# Patient Record
Sex: Female | Born: 1960 | Race: Black or African American | Hispanic: No | Marital: Married | Smoking: Former smoker
Health system: Southern US, Community
[De-identification: ages and names within clinical notes are randomized; demographics above are authoritative.]

## PROBLEM LIST (undated history)

## (undated) DIAGNOSIS — K449 Diaphragmatic hernia without obstruction or gangrene: Secondary | ICD-10-CM

## (undated) DIAGNOSIS — N83202 Unspecified ovarian cyst, left side: Secondary | ICD-10-CM

## (undated) DIAGNOSIS — M1990 Unspecified osteoarthritis, unspecified site: Secondary | ICD-10-CM

## (undated) DIAGNOSIS — I10 Essential (primary) hypertension: Secondary | ICD-10-CM

## (undated) DIAGNOSIS — I700 Atherosclerosis of aorta: Secondary | ICD-10-CM

## (undated) HISTORY — DX: Essential (primary) hypertension: I10

---

## 2004-11-09 ENCOUNTER — Ambulatory Visit: Payer: Self-pay | Admitting: Psychiatry

## 2004-11-09 ENCOUNTER — Other Ambulatory Visit (HOSPITAL_COMMUNITY): Admission: RE | Admit: 2004-11-09 | Discharge: 2005-02-07 | Payer: Self-pay | Admitting: Psychiatry

## 2012-08-16 ENCOUNTER — Ambulatory Visit: Payer: Self-pay | Admitting: Family Medicine

## 2012-09-19 ENCOUNTER — Ambulatory Visit: Payer: Self-pay | Admitting: Family Medicine

## 2012-10-24 ENCOUNTER — Ambulatory Visit: Payer: Self-pay | Admitting: Unknown Physician Specialty

## 2013-12-09 ENCOUNTER — Emergency Department: Payer: Self-pay | Admitting: Emergency Medicine

## 2013-12-22 ENCOUNTER — Emergency Department: Payer: Self-pay | Admitting: Internal Medicine

## 2014-02-20 ENCOUNTER — Ambulatory Visit: Payer: Self-pay | Admitting: Family Medicine

## 2014-02-27 ENCOUNTER — Ambulatory Visit: Payer: Self-pay | Admitting: Family Medicine

## 2017-10-27 ENCOUNTER — Encounter: Payer: Self-pay | Admitting: Emergency Medicine

## 2017-10-27 ENCOUNTER — Emergency Department
Admission: EM | Admit: 2017-10-27 | Discharge: 2017-10-27 | Disposition: A | Discharge status: Home / Self Care | Payer: Self-pay | Attending: Emergency Medicine | Admitting: Emergency Medicine

## 2017-10-27 DIAGNOSIS — X58XXXA Exposure to other specified factors, initial encounter: Secondary | ICD-10-CM | POA: Insufficient documentation

## 2017-10-27 DIAGNOSIS — R202 Paresthesia of skin: Secondary | ICD-10-CM | POA: Insufficient documentation

## 2017-10-27 DIAGNOSIS — Y9389 Activity, other specified: Secondary | ICD-10-CM | POA: Insufficient documentation

## 2017-10-27 DIAGNOSIS — R600 Localized edema: Secondary | ICD-10-CM | POA: Insufficient documentation

## 2017-10-27 DIAGNOSIS — Y9259 Other trade areas as the place of occurrence of the external cause: Secondary | ICD-10-CM | POA: Insufficient documentation

## 2017-10-27 DIAGNOSIS — S66912A Strain of unspecified muscle, fascia and tendon at wrist and hand level, left hand, initial encounter: Secondary | ICD-10-CM | POA: Insufficient documentation

## 2017-10-27 DIAGNOSIS — Y990 Civilian activity done for income or pay: Secondary | ICD-10-CM | POA: Insufficient documentation

## 2017-10-27 LAB — CBC WITH DIFFERENTIAL/PLATELET
Basophils Absolute: 0 10*3/uL (ref 0–0.1)
Basophils Relative: 1 %
EOS ABS: 0.1 10*3/uL (ref 0–0.7)
EOS PCT: 1 %
HCT: 38 % (ref 35.0–47.0)
Hemoglobin: 12.2 g/dL (ref 12.0–16.0)
LYMPHS ABS: 2 10*3/uL (ref 1.0–3.6)
LYMPHS PCT: 40 %
MCH: 23.7 pg — ABNORMAL LOW (ref 26.0–34.0)
MCHC: 32.1 g/dL (ref 32.0–36.0)
MCV: 73.7 fL — AB (ref 80.0–100.0)
MONO ABS: 0.4 10*3/uL (ref 0.2–0.9)
MONOS PCT: 8 %
Neutro Abs: 2.5 10*3/uL (ref 1.4–6.5)
Neutrophils Relative %: 50 %
PLATELETS: 281 10*3/uL (ref 150–440)
RBC: 5.15 MIL/uL (ref 3.80–5.20)
RDW: 16.3 % — AB (ref 11.5–14.5)
WBC: 5.1 10*3/uL (ref 3.6–11.0)

## 2017-10-27 LAB — BASIC METABOLIC PANEL
Anion gap: 8 (ref 5–15)
BUN: 15 mg/dL (ref 6–20)
CHLORIDE: 108 mmol/L (ref 101–111)
CO2: 24 mmol/L (ref 22–32)
CREATININE: 0.84 mg/dL (ref 0.44–1.00)
Calcium: 8.7 mg/dL — ABNORMAL LOW (ref 8.9–10.3)
GFR calc Af Amer: >60 mL/min (ref >60)
GFR calc non Af Amer: >60 mL/min (ref >60)
GLUCOSE: 122 mg/dL — AB (ref 65–99)
POTASSIUM: 3.4 mmol/L — AB (ref 3.5–5.1)
Sodium: 140 mmol/L (ref 135–145)

## 2017-10-27 MED ORDER — LISINOPRIL 20 MG PO TABS: 20.0000 mg | ORAL_TABLET | Freq: Every day | ORAL | 2 refills | Status: DC

## 2017-10-27 MED ORDER — LISINOPRIL 10 MG PO TABS
20.0000 mg | ORAL_TABLET | Freq: Once | ORAL | Status: AC
  Administered 2017-10-27: 20 mg via ORAL

## 2017-10-27 MED ORDER — MELOXICAM 15 MG PO TABS: 15.0000 mg | ORAL_TABLET | Freq: Every day | ORAL | 1 refills | Status: AC

## 2017-10-27 MED ORDER — LISINOPRIL 10 MG PO TABS
ORAL_TABLET | ORAL | Status: AC
  Filled 2017-10-27: qty 2

## 2017-10-27 NOTE — ED Notes (Signed)
See triage note  states she developed pain to left hand/wrist area   Denies any specific injury  States she does a lot of repetative movement at work  Min relief with OTC ibu

## 2017-10-27 NOTE — ED Provider Notes (Signed)
[facility] Emergency Department Provider Note ____________________________________________  Time seen: 1258  I have reviewed the triage vital signs and the nursing notes.  HISTORY  Chief Complaint  Hand Pain  HPI Jackie Shea is a 57 y.o.right-handed female, who presents to the ED, accompanied by her husband, for evaluation of left (>right) hand and wrist pain. 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 She has noted increasing incidence of left > right hand pain, stiffness, and paresthesias. She notes pain increased related to her work activities. She also notes nighttime paresthesias and skin changes. She describes red hand and at times, stiffness that is only relieved with running her hands under hot water. She denies any recent injury, accident, or trauma. She does report weakness with her grip. She has dropped or nearly dropped tray of biscuits and boxes of buttermilk. She does ibuprofen, with limited pain relief. She has purchased and wears a neoprene thumb spica splint for support. She denies any previous evaluation or treatment. She plans to file a claim with her company's workers' compensation provider. She notified her supervisor that she was coming in today, her day off, for evaluation.  History reviewed. No pertinent past medical history.  There are no active problems to display for this patient.  History reviewed. No pertinent surgical history.  Prior to Admission medications   Medication Sig Start Date End Date Taking? Authorizing Provider  lisinopril (PRINIVIL,ZESTRIL) 20 MG tablet Take 1 tablet (20 mg total) by mouth daily. 10/27/17 01/25/18  Dilan Fullenwider, Charlesetta IvoryJenise V Bacon, PA-C  meloxicam (MOBIC) 15 MG tablet Take 1 tablet (15 mg total) by mouth daily. 10/27/17 12/26/17  Torrez Renfroe, Charlesetta IvoryJenise V Bacon, PA-C    Allergies Patient has no known allergies.  No family history on file.  Social History Social History   Tobacco  Use  . Smoking status: Former Games developermoker  . Smokeless tobacco: Never Used  Substance Use Topics  . Alcohol use: No    Frequency: Never  . Drug use: No    Review of Systems  Constitutional: Negative for fever. Cardiovascular: Negative for chest pain. Respiratory: Negative for shortness of breath. Gastrointestinal: Negative for abdominal pain, vomiting and diarrhea. Genitourinary: Negative for dysuria. Musculoskeletal: Negative for back pain. Left UE pain & paresthesias as above Skin: Negative for rash. Neurological: Negative for headaches, focal weakness or numbness. ____________________________________________  PHYSICAL EXAM:  VITAL SIGNS: ED Triage Vitals  Enc Vitals Group     BP 10/27/17 1233 (!) 159/89     Pulse Rate 10/27/17 1233 73     Resp 10/27/17 1233 13     Temp 10/27/17 1233 97.7 F (36.5 C)     Temp Source 10/27/17 1233 Oral     SpO2 10/27/17 1233 99 %     Weight 10/27/17 1233 170 lb (77.1 kg)     Height 10/27/17 1233 5\' 5"  (1.651 m)     Head Circumference --      Peak Flow --      Pain Score 10/27/17 1236 10     Pain Loc --      Pain Edu? --      Excl. in GC? --     Constitutional: Alert and oriented. Well appearing and in no distress. Head: Normocephalic and atraumatic. Neck: Supple. No thyromegaly. Cardiovascular: Normal rate, regular rhythm. Normal distal pulses. Respiratory: Normal respiratory effort. No wheezes/rales/rhonchi. Musculoskeletal: Hands without obvious deformity, edema, erythema, or soft tissue swelling. Normal composite fist, but  self-limited on the left. Nontender with normal range of motion in all extremities. Normal rotator cuff testing.  Neurologic:  Normal gross sensation. Normal intrinsic and opposition testing. Negative cubital/carpal Tinel's.  Normal speech and language. No gross focal neurologic deficits are appreciated. Skin:  Skin is warm, dry and intact. No rash noted. Normal capillary refill. No signs of scleroderma. Nails  normal without pitting, spooning, or clubbing.  Psychiatric: Mood and affect are normal. Patient exhibits appropriate insight and judgment. ____________________________________________   LABS (pertinent positives/negatives)  Labs Reviewed  BASIC METABOLIC PANEL - Abnormal; Notable for the following components:      Result Value   Potassium 3.4 (*)    Glucose, Bld 122 (*)    Calcium 8.7 (*)    All other components within normal limits  CBC WITH DIFFERENTIAL/PLATELET - Abnormal; Notable for the following components:   MCV 73.7 (*)    MCH 23.7 (*)    RDW 16.3 (*)    All other components within normal limits  ____________________________________________  PROCEDURES  Procedures Lisinopril 20 mg PO ____________________________________________  INITIAL IMPRESSION / ASSESSMENT AND PLAN / ED COURSE  Patient with ED evaluation of LUE paresthesias and weakness. Her exam is consistent with some likely overuse syndrome to the musculature of the forearms. She may also have some underlying arthritis. Discussed the fact that the symptoms could also be multifactorial: including HTN, peripheral edema, and arthritis causing her symptoms. We discussed the need to establish a medical home. She is placed in a velcro wrist splint for support. She will follow-up with her company HR representative and seek medical care with her worker's compensation provider. A prescription for her previous HTN medicine is provided. ____________________________________________  FINAL CLINICAL IMPRESSION(S) / ED DIAGNOSES  Final diagnoses:  Paresthesias in left hand  Overuse syndrome of hand, left, initial encounter  Hand edema     Lissa Hoard, PA-C 10/27/17 1723    Minna Antis, MD 10/28/17 1436

## 2017-10-27 NOTE — ED Triage Notes (Signed)
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  Patient plans to file this as a workers comp claim with [employer].  Patient in NAD at this time and has good dexterity in all fingers.  Patient states the pain is sharp and worsens with movement.

## 2017-10-27 NOTE — Discharge Instructions (Signed)
Your exam and labs are consistent with muscle strain and overuse syndrome. This is musculoskeletal (muscle, joint, & tendon) pain due to (work) activities. You have experienced hand pain, swelling, numbness, and weakness. These are common symptoms. Follow-up with one of the local community clinics to set up a medical home. Take the prescription med as directed. Follow-up with your company for referral to their provider of choice. Wear the splint while working.

## 2019-07-15 ENCOUNTER — Emergency Department: Payer: Self-pay

## 2019-07-15 ENCOUNTER — Encounter: Payer: Self-pay | Admitting: Emergency Medicine

## 2019-07-15 ENCOUNTER — Emergency Department
Admission: EM | Admit: 2019-07-15 | Discharge: 2019-07-15 | Disposition: A | Discharge status: Home / Self Care | Payer: Self-pay | Attending: Emergency Medicine | Admitting: Emergency Medicine

## 2019-07-15 ENCOUNTER — Other Ambulatory Visit: Payer: Self-pay

## 2019-07-15 DIAGNOSIS — Z87891 Personal history of nicotine dependence: Secondary | ICD-10-CM | POA: Insufficient documentation

## 2019-07-15 DIAGNOSIS — Z79899 Other long term (current) drug therapy: Secondary | ICD-10-CM | POA: Insufficient documentation

## 2019-07-15 DIAGNOSIS — M25532 Pain in left wrist: Secondary | ICD-10-CM | POA: Insufficient documentation

## 2019-07-15 DIAGNOSIS — G8929 Other chronic pain: Secondary | ICD-10-CM | POA: Insufficient documentation

## 2019-07-15 DIAGNOSIS — M5442 Lumbago with sciatica, left side: Secondary | ICD-10-CM | POA: Insufficient documentation

## 2019-07-15 DIAGNOSIS — M25512 Pain in left shoulder: Secondary | ICD-10-CM | POA: Insufficient documentation

## 2019-07-15 MED ORDER — HYDROCODONE-ACETAMINOPHEN 5-325 MG PO TABS
1.0000 | ORAL_TABLET | Freq: Once | ORAL | Status: AC
  Administered 2019-07-15: 1 via ORAL
  Filled 2019-07-15: qty 1

## 2019-07-15 MED ORDER — PREDNISONE 10 MG PO TABS: ORAL_TABLET | ORAL | 0 refills | Status: DC

## 2019-07-15 MED ORDER — ORPHENADRINE CITRATE 30 MG/ML IJ SOLN
60.0000 mg | Freq: Two times a day (BID) | INTRAMUSCULAR | Status: DC
  Administered 2019-07-15: 60 mg via INTRAMUSCULAR
  Filled 2019-07-15: qty 2

## 2019-07-15 MED ORDER — ORPHENADRINE CITRATE ER 100 MG PO TB12: 100.0000 mg | ORAL_TABLET | Freq: Two times a day (BID) | ORAL | 0 refills | Status: AC

## 2019-07-15 NOTE — ED Provider Notes (Signed)
[facility] Emergency Department Provider Note ____________________________________________  Time seen: 1920  I have reviewed the triage vital signs and the nursing notes.  HISTORY  Chief Complaint  Fall   HPI Jackie Shea is a 58 y.o. female presents to the ER today with complaint of acute on chronic left shoulder pain, left wrist pain, low back pain and left hip pain.  She reports this started January 2019 after a fall at work.  She describes the shoulder pain as sore and achy with decreased range of motion.  She denies elbow pain but believes she may have fractured her elbow during the fall that occurred on January 2019.  She describes sharp, shooting pains that start at the elbow and radiate into the tips of her fingers on her left hand.  She reports associated numbness, tingling and weakness.  She is right-handed not left-handed.  She describes the low back pain as sharp, stabbing and radiating into her bilateral lower legs, L >R. She denies weakness. She denies loss of bowel or bladder. She reports she initially had PT after her fall with some improvement in pain. She does not have a PCP, and has been wearing a brace to her left wrist and taking Ibuprofen/Tylenol with minimal relief. She can not sleep at night because the pain is too bad.  History reviewed. No pertinent past medical history.  There are no active problems to display for this patient.   History reviewed. No pertinent surgical history.  Prior to Admission medications   Medication Sig Start Date End Date Taking? Authorizing Provider  lisinopril (PRINIVIL,ZESTRIL) 20 MG tablet Take 1 tablet (20 mg total) by mouth daily. 10/27/17 01/25/18  Menshew, Dannielle Karvonen, PA-C  orphenadrine (NORFLEX) 100 MG tablet Take 1 tablet (100 mg total) by mouth 2 (two) times daily. 07/15/19 07/14/20  Jearld Fenton, NP  predniSONE (DELTASONE) 10 MG tablet Take 3 tabs on days 1-3, take 2 tabs on days 4-6, take 1 tab on  days 7-9 07/15/19   Jearld Fenton, NP    Allergies Patient has no known allergies.  No family history on file.  Social History Social History   Tobacco Use  . Smoking status: Former Research scientist (life sciences)  . Smokeless tobacco: Never Used  Substance Use Topics  . Alcohol use: No    Frequency: Never  . Drug use: No    Review of Systems  Constitutional: Negative for fever. Cardiovascular: Negative for chest pain or chest tightness. Respiratory: Negative for shortness of breath. Musculoskeletal: Positive for left shoulder pain, left wrist pain, low back pain and left hip pain.  Negative for neck, knee or ankle pain.  Negative for joint swelling. Skin: Negative for rash. Neurological: Positive for numbness, tingling of left arm and bilateral lower extremities.  Positive for weakness to the left upper extremity.   ____________________________________________  PHYSICAL EXAM:  VITAL SIGNS: ED Triage Vitals  Enc Vitals Group     BP 07/15/19 1739 (!) 159/81     Pulse Rate 07/15/19 1739 79     Resp 07/15/19 1739 16     Temp 07/15/19 1739 98.4 F (36.9 C)     Temp Source 07/15/19 1739 Oral     SpO2 07/15/19 1739 98 %     Weight 07/15/19 1737 165 lb (74.8 kg)     Height 07/15/19 1737 5\' 5"  (1.651 m)     Head Circumference --      Peak Flow --      Pain  Score 07/15/19 1736 8     Pain Loc --      Pain Edu? --      Excl. in GC? --     Constitutional: Alert and oriented. Well appearing and in no distress. Cardiovascular: Normal rate, regular rhythm.  Radial pulses 2+ bilaterally.  Pedal and PT pulse pulses 2+ bilaterally.  Cap refill less than 3 seconds. Respiratory: Normal respiratory effort. No wheezes/rales/rhonchi.. Musculoskeletal: Normal flexion, extension and rotation of the cervical spine.  No bony tenderness noted over the cervical spine.  Decreased external rotation of the left shoulder secondary to pain.  Normal internal rotation, abduction and abduction.  Normal flexion, extension  and rotation of the left elbow.  Normal flexion, extension and rotation of the left wrist.  Handgrips equal.  Decreased flexion and extension of the lumbar spine.  Bony tenderness noted over the lumbar spine.  Decreased internal and external rotation, abduction and abduction of the left hip.  Normal external and internal rotation, abduction and abduction of the right hip.  Normal flexion and extension of bilateral knees.  No joint swelling noted.  Strength 4/5 LLE.  Strength 5/5 RLE.  Gait not visualized. Neurologic:   Normal speech and language.  Positive SLR on the left. Skin:  Skin is warm, dry and intact. No rash noted. ____________________________________________   RADIOLOGY  Imaging Orders     DG Shoulder Left     DG Lumbar Spine Complete     DG Hip Unilat W or Wo Pelvis 2-3 Views Left     DG Wrist Complete Left IMPRESSION: 1. No acute osseous abnormality 2. Slight narrowing of the subacromial space, can be seen with rotator cuff disease  IMPRESSION: 1. 12 mm anterolisthesis L5 on S1. Associated moderate degenerative changes 2. No acute osseous abnormality  IMPRESSION: Negative.  IMPRESSION: 1. No acute osseous abnormality 2. 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     I reviewed the patient's prescription history over the last 12 months in the multi-state controlled substances database(s) that  includes [place], [place], [place], [place], [place], [place], [place], [place], [place], [place], [place], [place], [place], and [place].  Results were notable for no controlled substance in the last 6 months. ____________________________________________  FINAL CLINICAL IMPRESSION(S) / ED DIAGNOSES  Final diagnoses:  Chronic left shoulder pain  Chronic pain of left wrist  Chronic midline low back pain with left-sided sciatica   Nicki Reaper, NP    Lorre Munroe, NP 07/15/19 2147    Sharman Cheek, MD 07/19/19 1527

## 2019-07-15 NOTE — ED Notes (Signed)
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 Pt with c/o pain. No swelling noted.

## 2019-07-15 NOTE — ED Triage Notes (Signed)
Pt to ED via POV, pt states that she fell last year at work, has been in physical therapy at the time. Pt states that she has been having pain since she fell 1 year ago. Pt states that she is unable to sleep due to the pain. Pt is in NAD at this time.

## 2019-07-15 NOTE — Discharge Instructions (Addendum)
You were seen today for left shoulder pain, paresthesia left arm and low back pain with sciatica.  X-ray of your shoulder shows some narrowing that can be seen in rotator cuff issues.  The x-ray of your left wrist show some arthritis.  The x-ray of your low back shows some arthritis.  The x-ray of your left hip is normal.  I am given you a prescription for prednisone and a muscle relaxer.  Please do not take any anti-inflammatories OTC while on prednisone.  Be aware that the muscle relaxer may cause sedation.  Please call the orthopedist for follow-up

## 2020-11-17 IMAGING — CR DG SHOULDER 2+V*L*
1 series · 2 of 2 positions shown · non-contrast
Comparison: None.

CLINICAL DATA: Chronic pain for 1 year

EXAM:
LEFT SHOULDER - 2+ VIEW

[Series 1: dg shoulder left · 0.14mm/px · 2 of 2 slices shown]
[im 1/2]
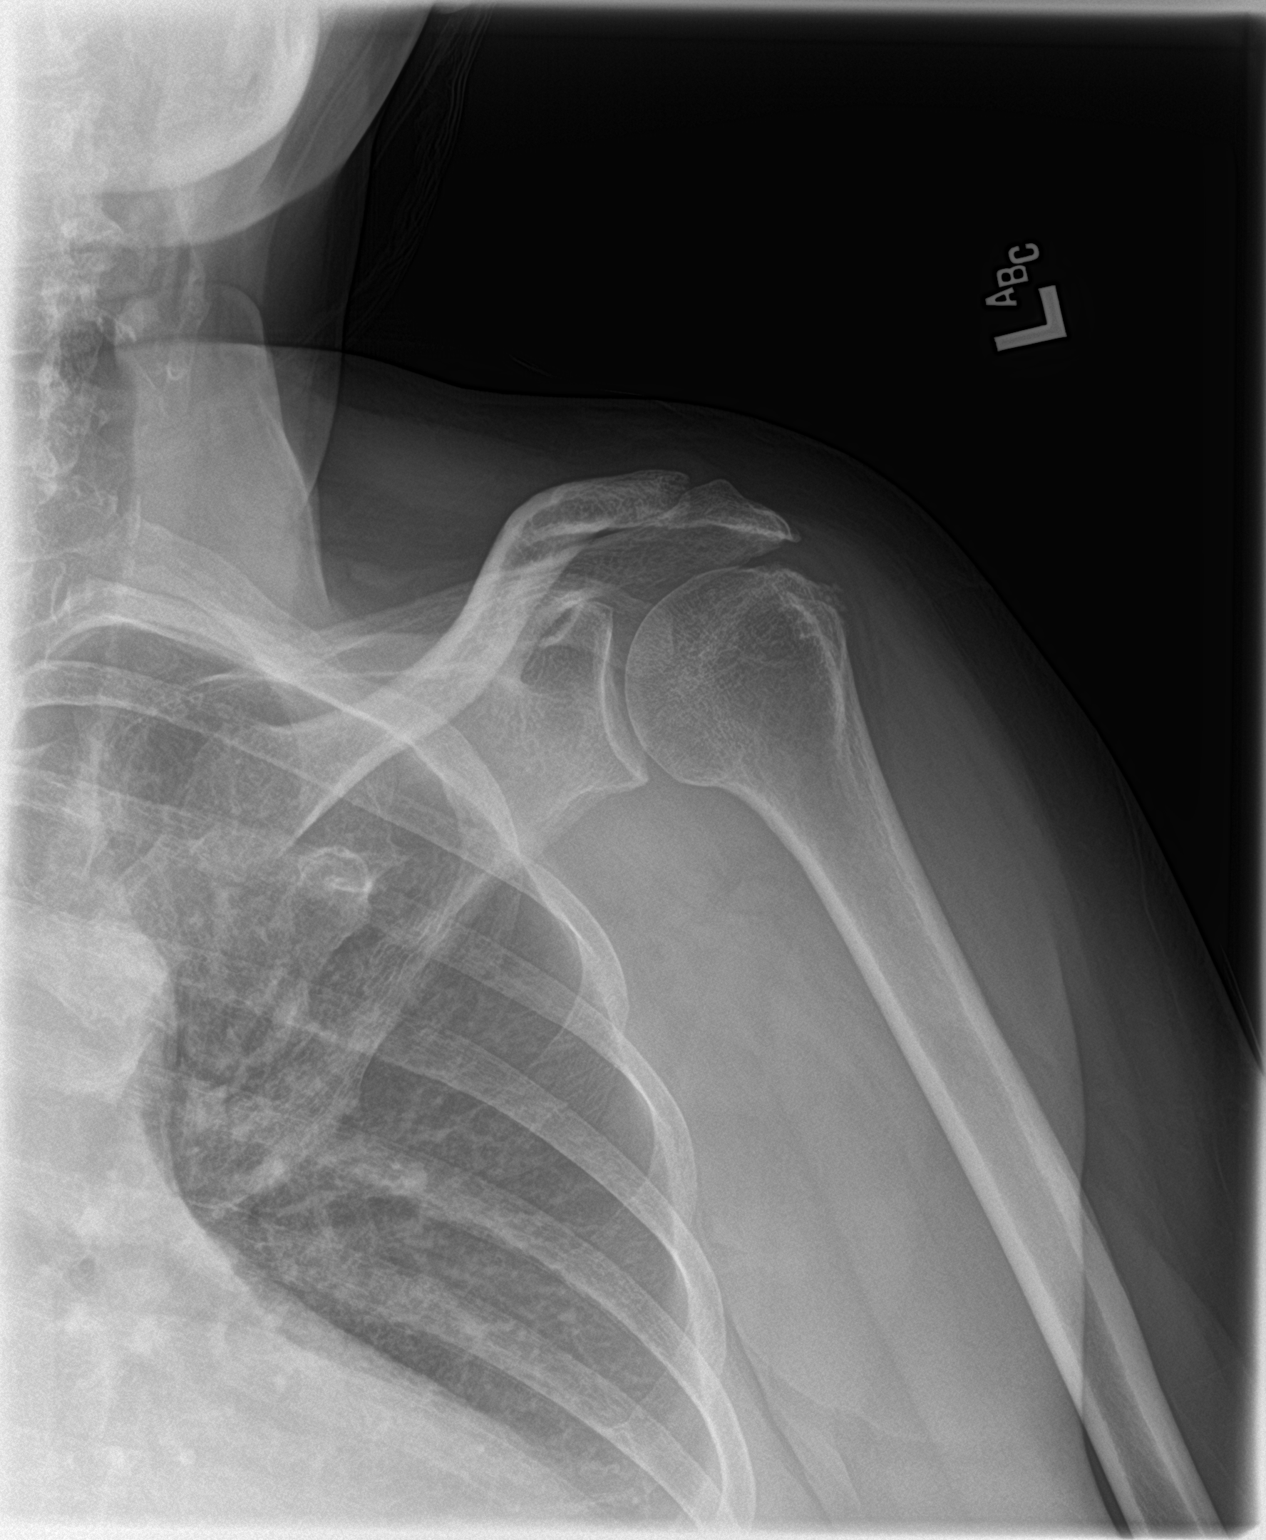
[im 2/2]
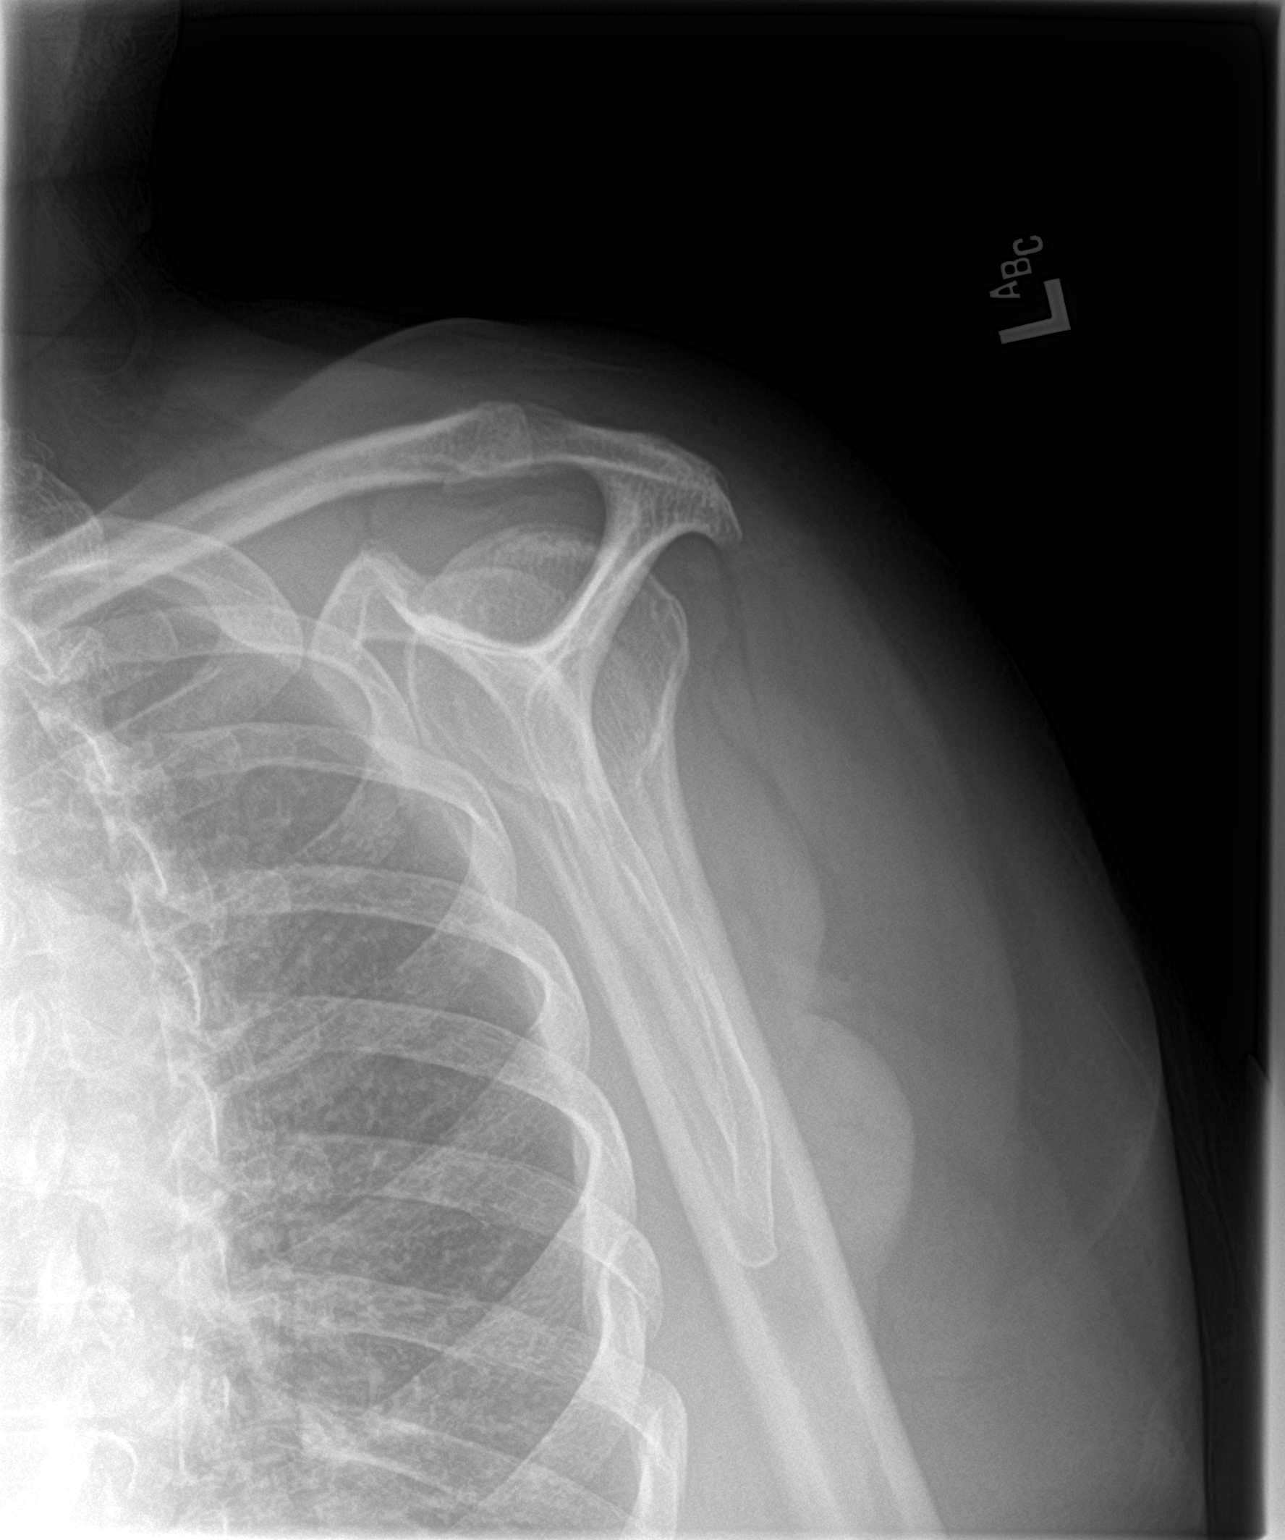

[2 of 2 positions shown; findings below may reference images not displayed]

FINDINGS: No fracture or malalignment. The AC joint is intact. Slight
narrowing of the subacromial space.
IMPRESSION: 1. No acute osseous abnormality
2. Slight narrowing of the subacromial space, can be seen with
rotator cuff disease

## 2020-11-17 IMAGING — CR DG HIP (WITH OR WITHOUT PELVIS) 2-3V*L*
1 series · 3 of 3 positions shown · non-contrast
Comparison: None.

CLINICAL DATA: Chronic pain for greater than a year

EXAM:
DG HIP (WITH OR WITHOUT PELVIS) 2-3V LEFT

[Series 1: dg hip unilat w or w/o pelvis 2-3 views  · non-contrast · 0.14mm/px · 3 of 3 slices shown]
[im 1/3]
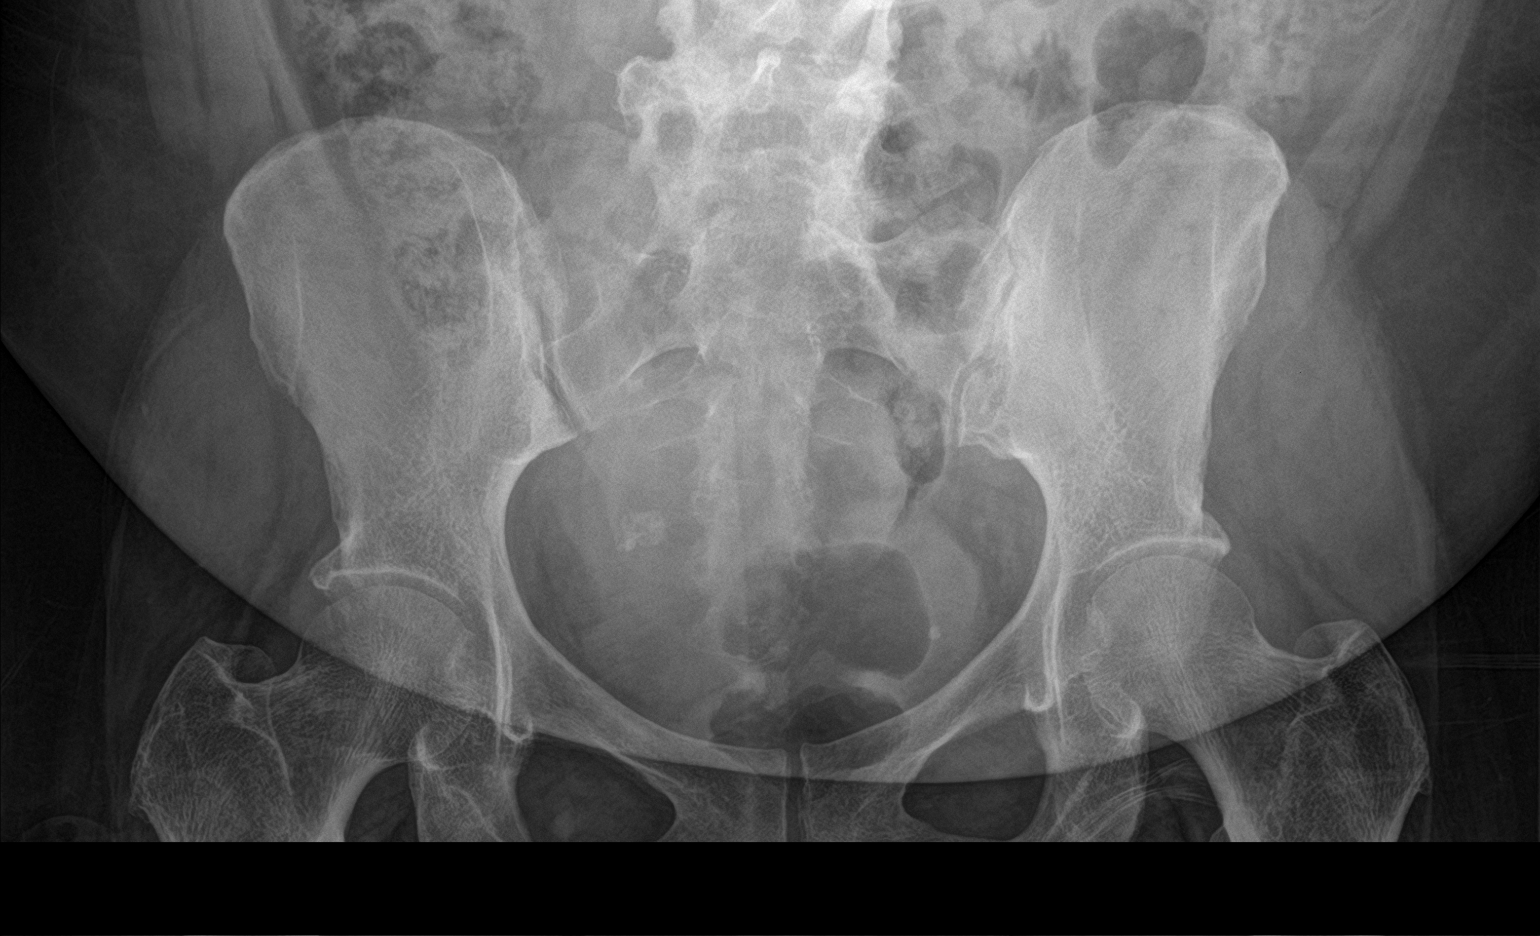
[im 2/3]
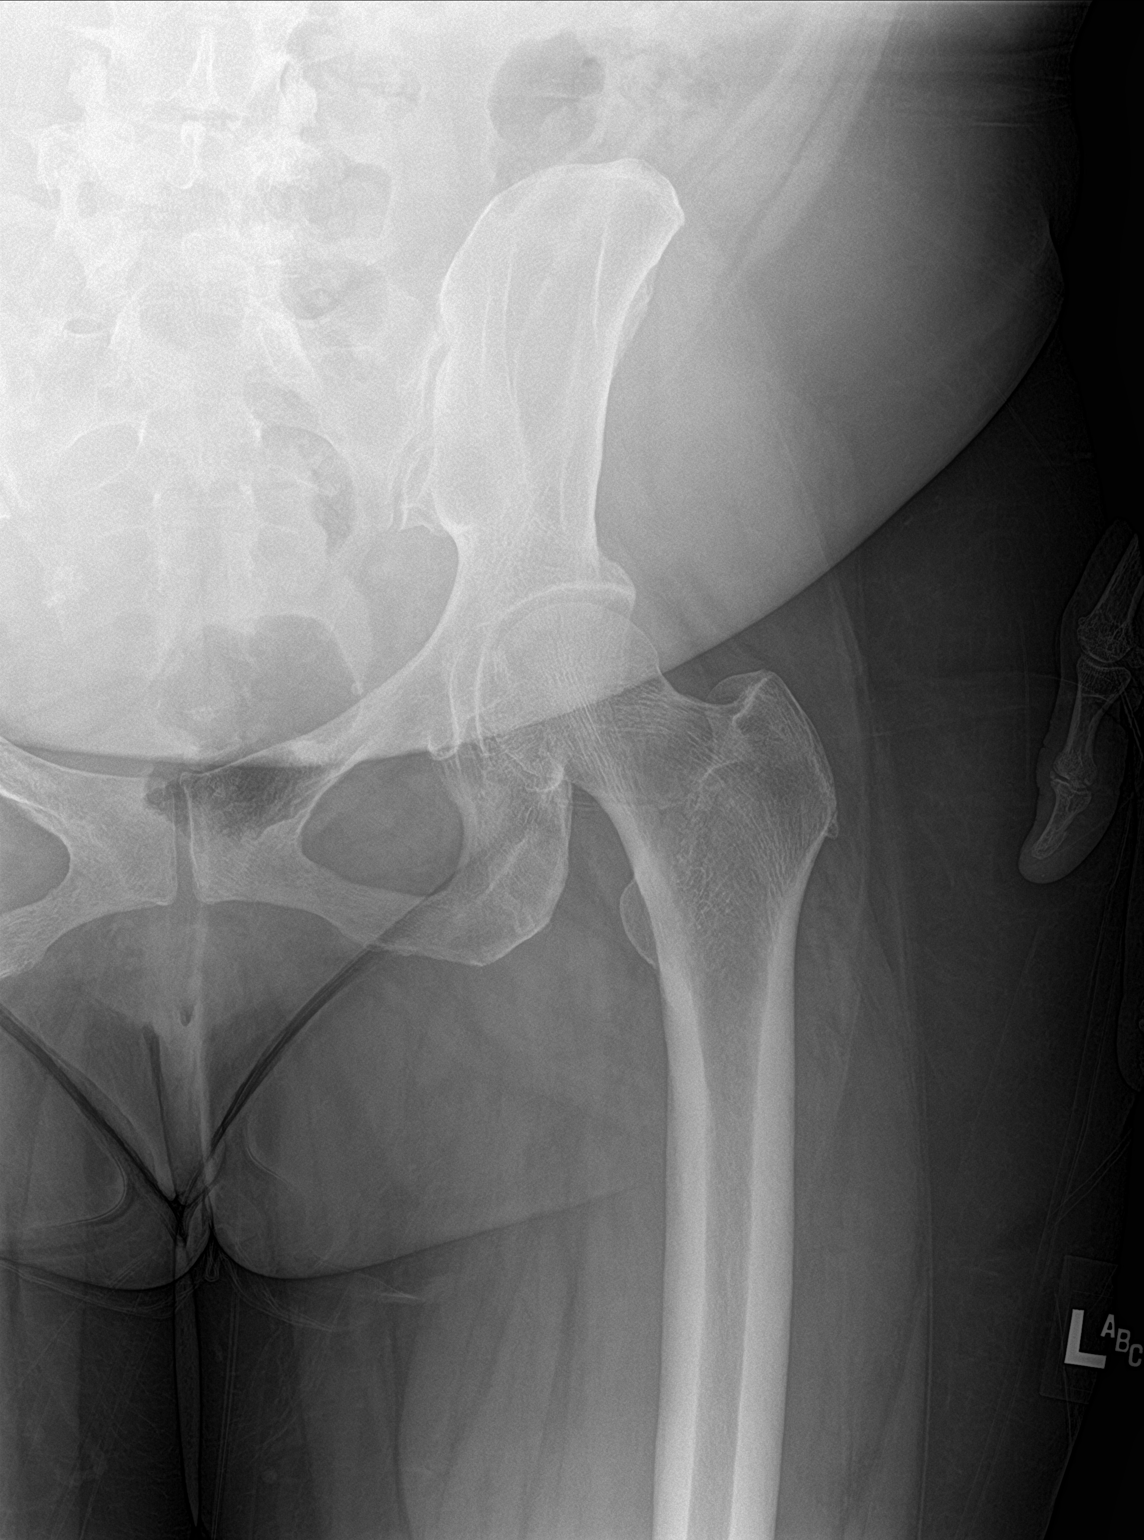
[im 3/3]
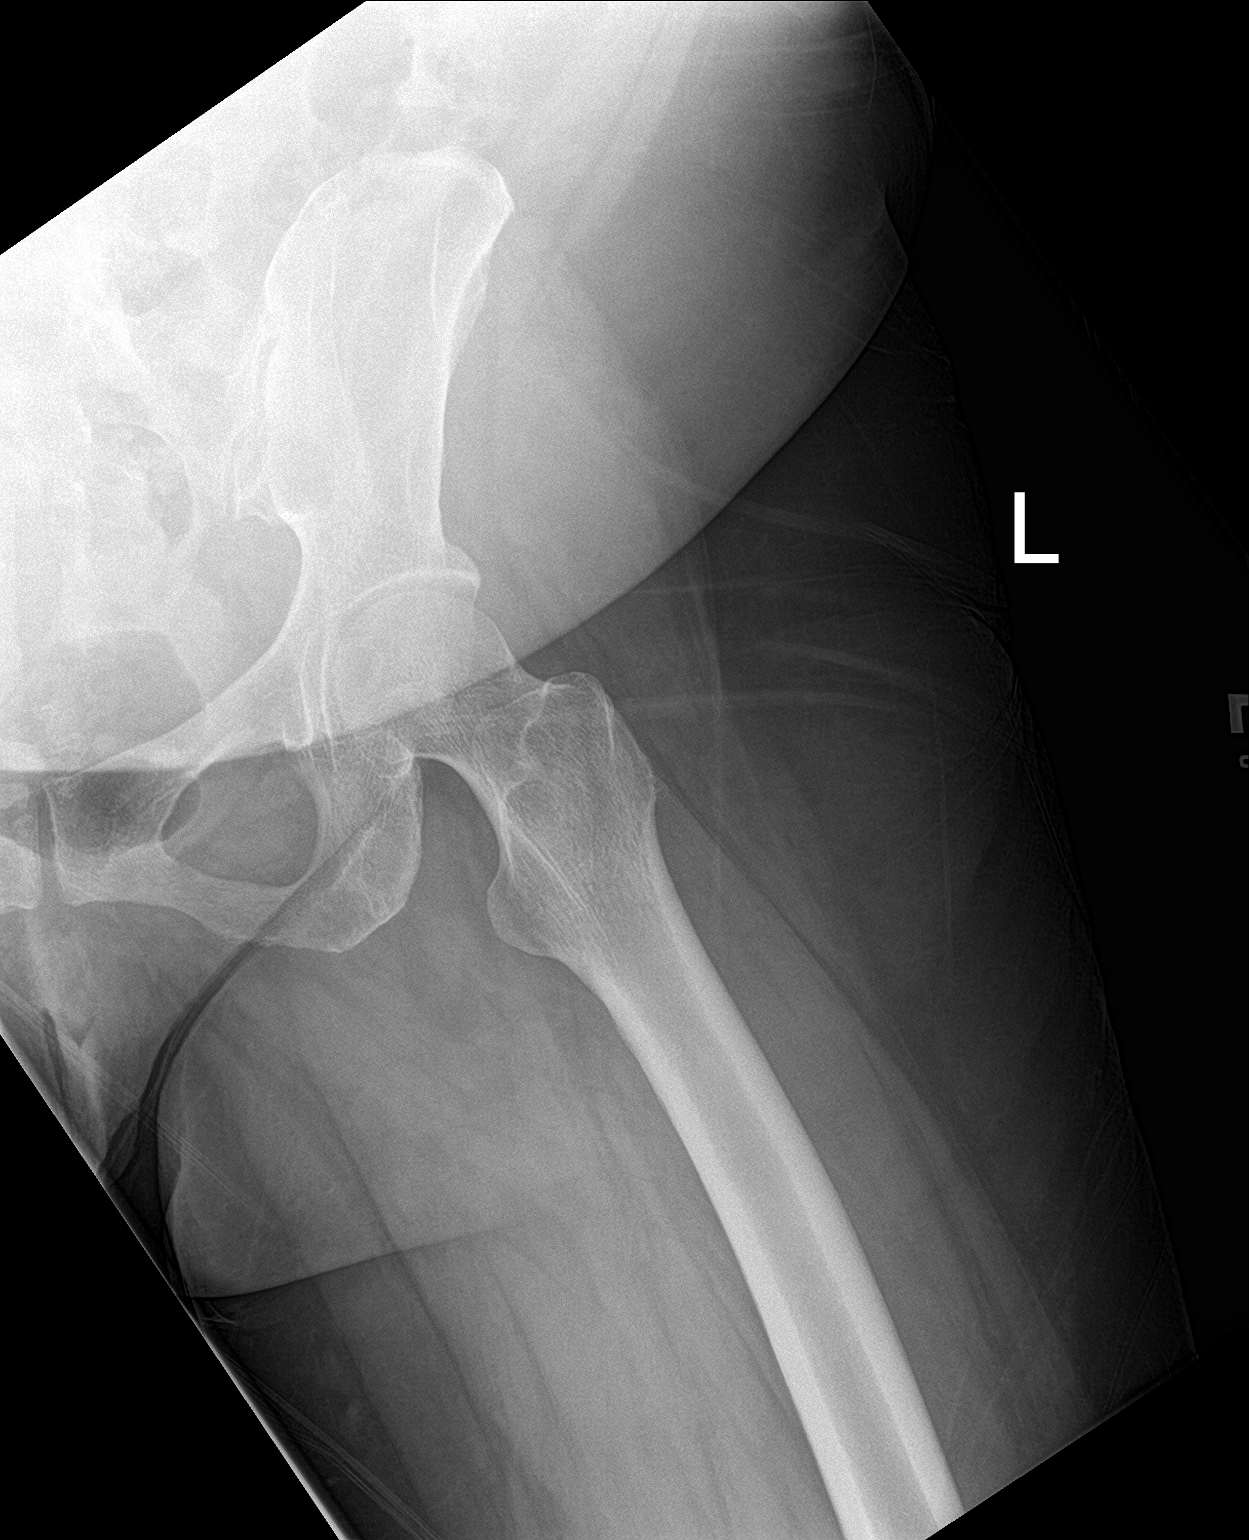

[3 of 3 positions shown; findings below may reference images not displayed]

FINDINGS: Mild right SI joint sclerosis. Pubic symphysis and rami appear
intact. Both femoral heads project in joint. No acute displaced
fracture or malalignment. Joint spaces are relatively maintained.
IMPRESSION: Negative.

## 2020-11-17 IMAGING — CR DG WRIST COMPLETE 3+V*L*
1 series · 4 of 4 positions shown · non-contrast
Comparison: None.

CLINICAL DATA: Chronic pain for a year

EXAM:
LEFT WRIST - COMPLETE 3+ VIEW

[Series 1: dg wrist complete left · 0.14mm/px · 4 of 4 slices shown]
[im 1/4]
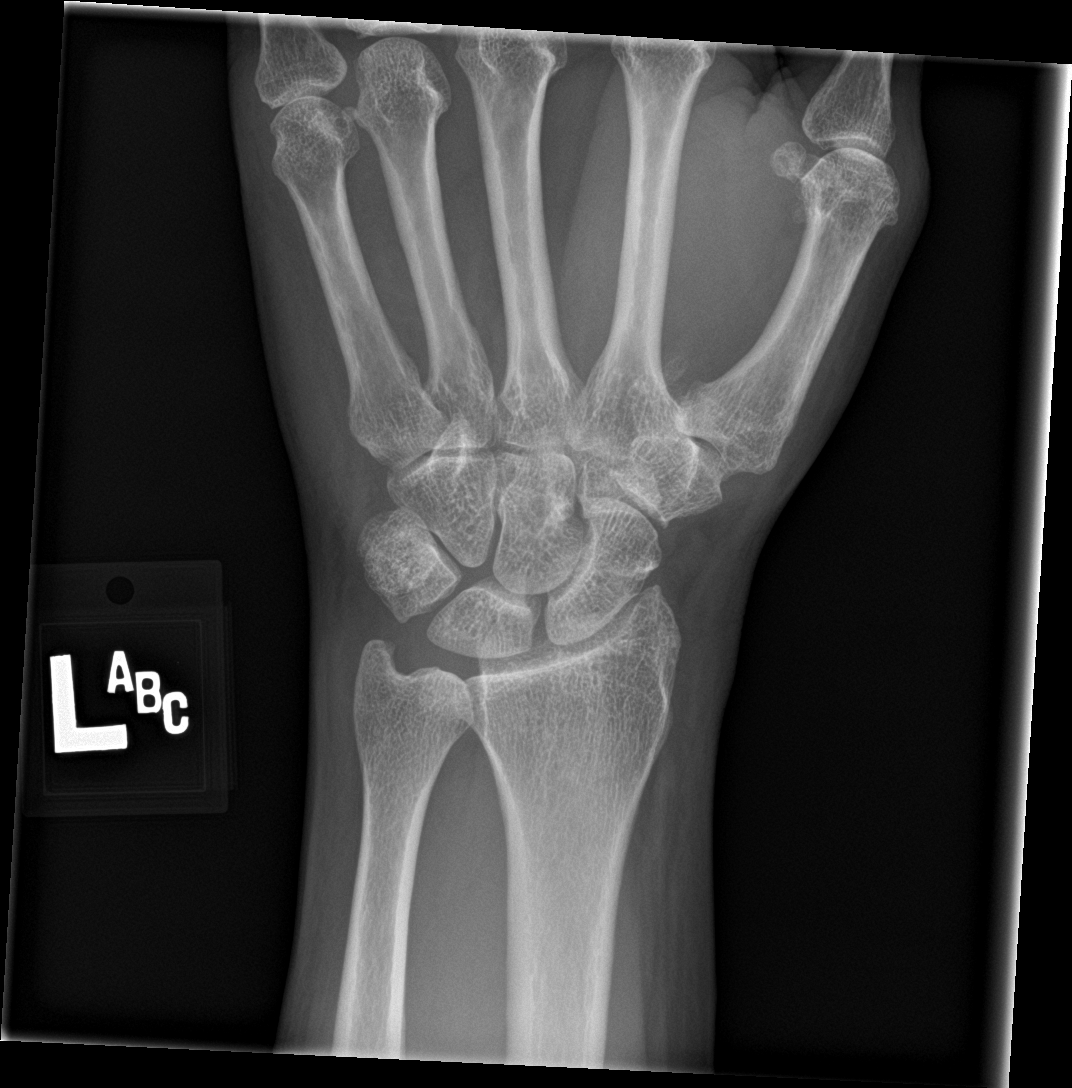
[im 2/4]
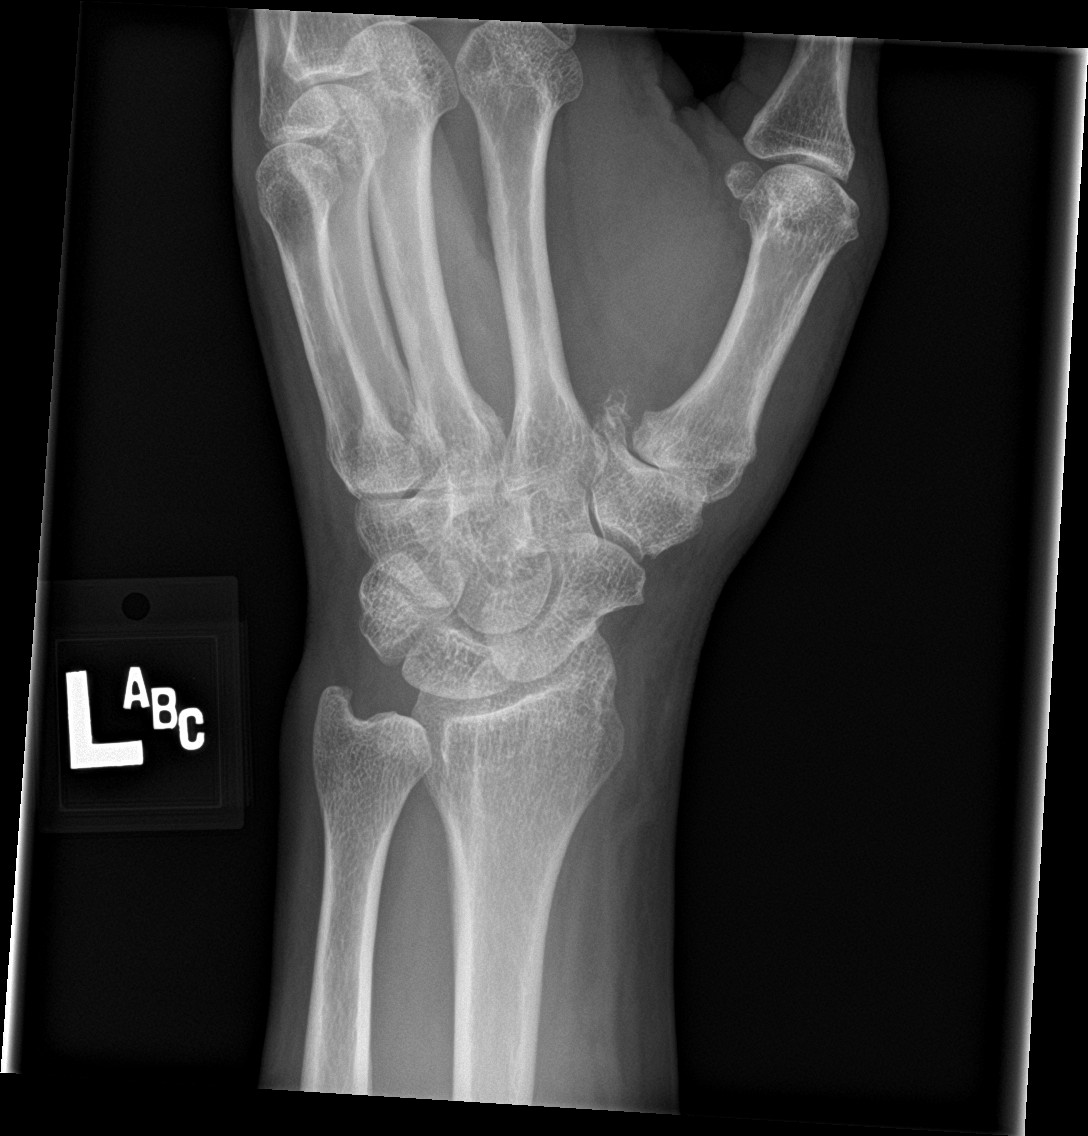
[im 3/4]
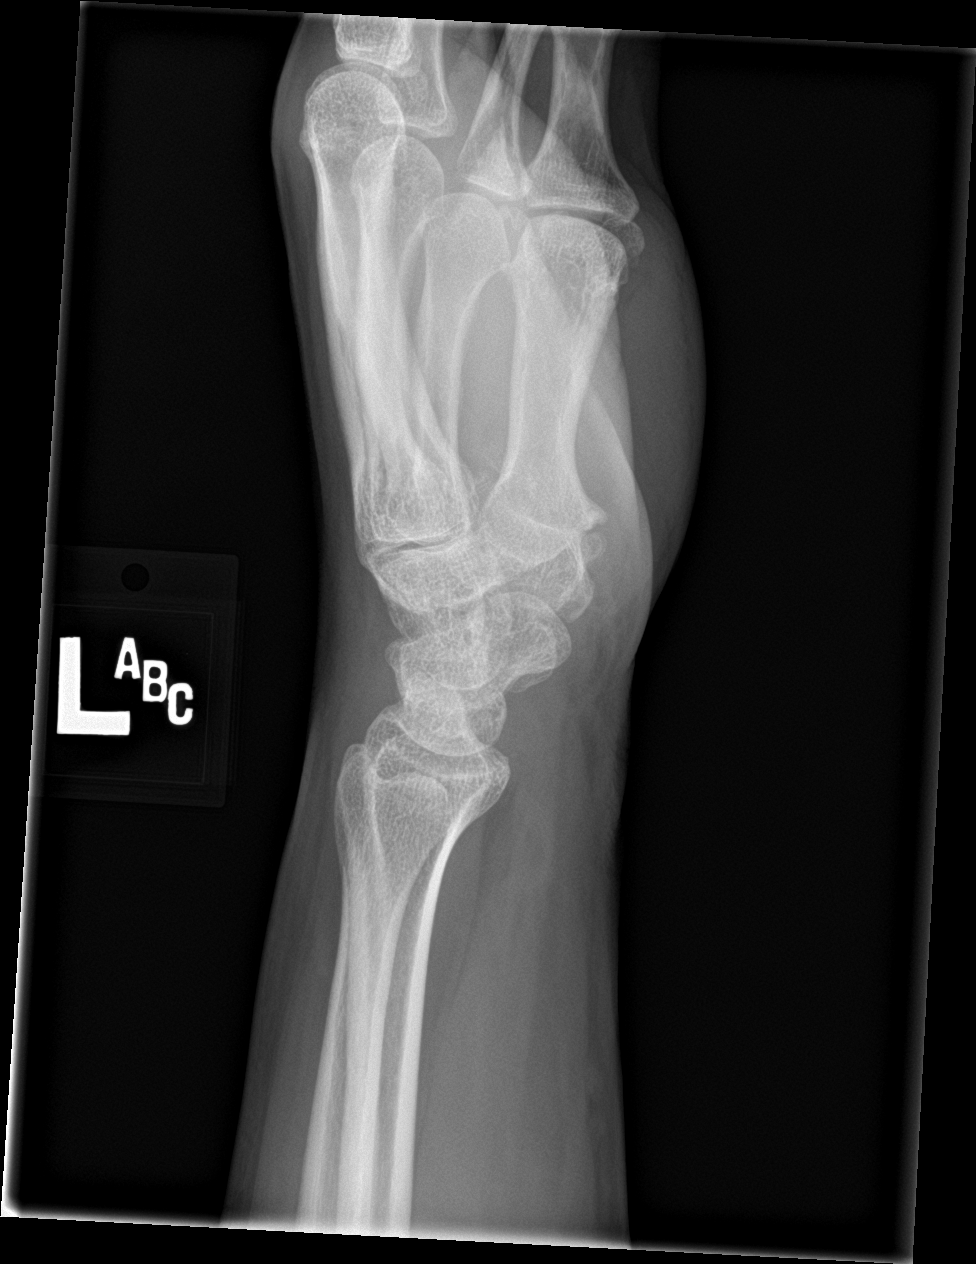
[im 4/4]
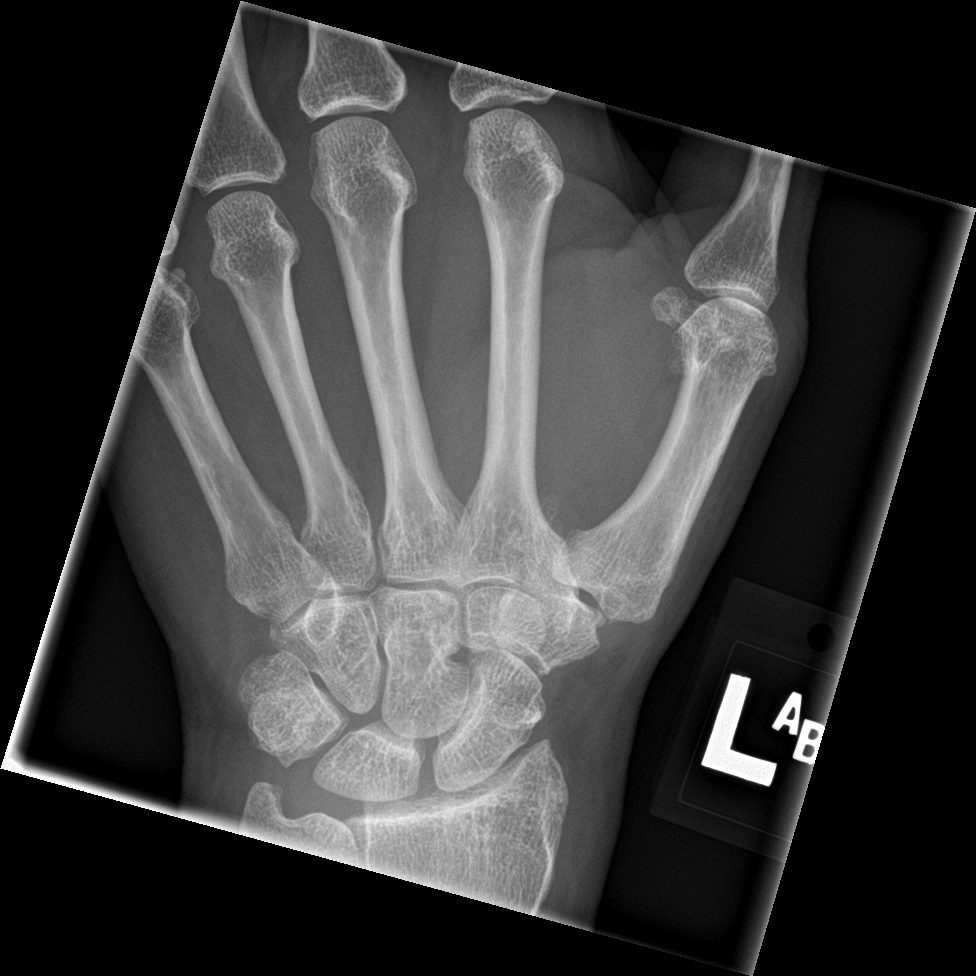

[4 of 4 positions shown; findings below may reference images not displayed]

FINDINGS: No fracture or malalignment. Mild arthritis at the first [facility] joint
and radiocarpal interval. Soft tissues are unremarkable
IMPRESSION: 1. No acute osseous abnormality
2. Mild arthritis on the radial side of the wrist and at the first
[facility] joint

## 2020-11-17 IMAGING — CR DG LUMBAR SPINE COMPLETE 4+V
1 series · 5 of 5 positions shown · non-contrast
Comparison: Chest x-ray 12/09/2013

CLINICAL DATA: Chronic back pain remote history of fall

EXAM:
LUMBAR SPINE - COMPLETE 4+ VIEW

[Series 1: dg lumbar spine complete 4 +v · 0.14mm/px · 5 of 5 slices shown]
[im 1/5]
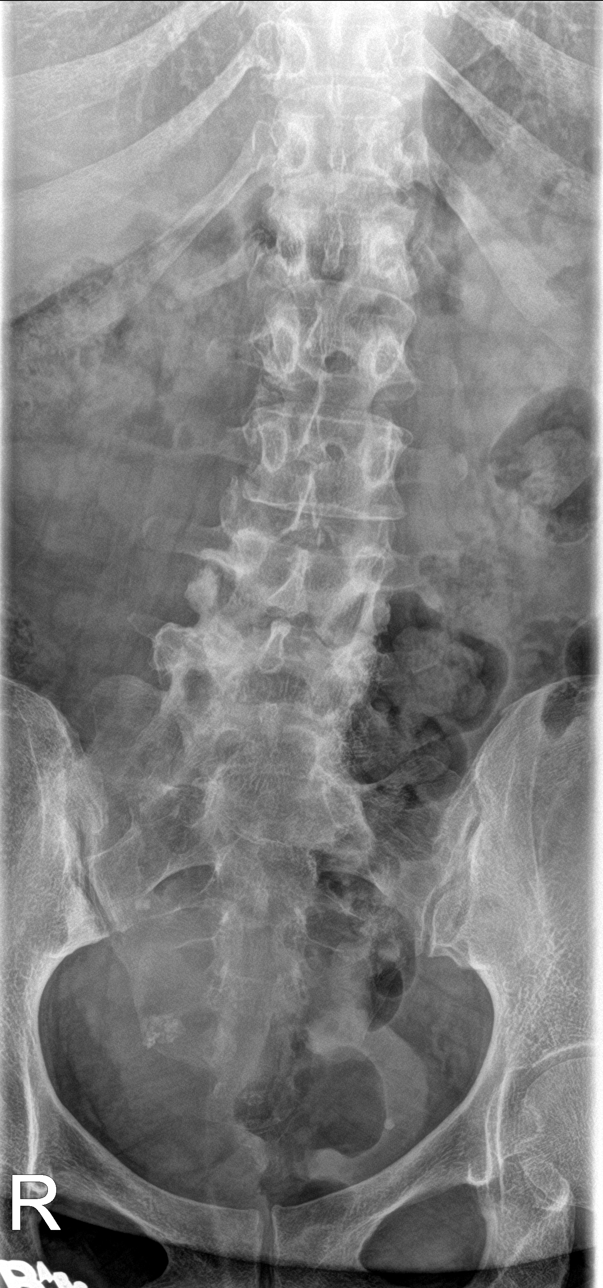
[im 2/5]
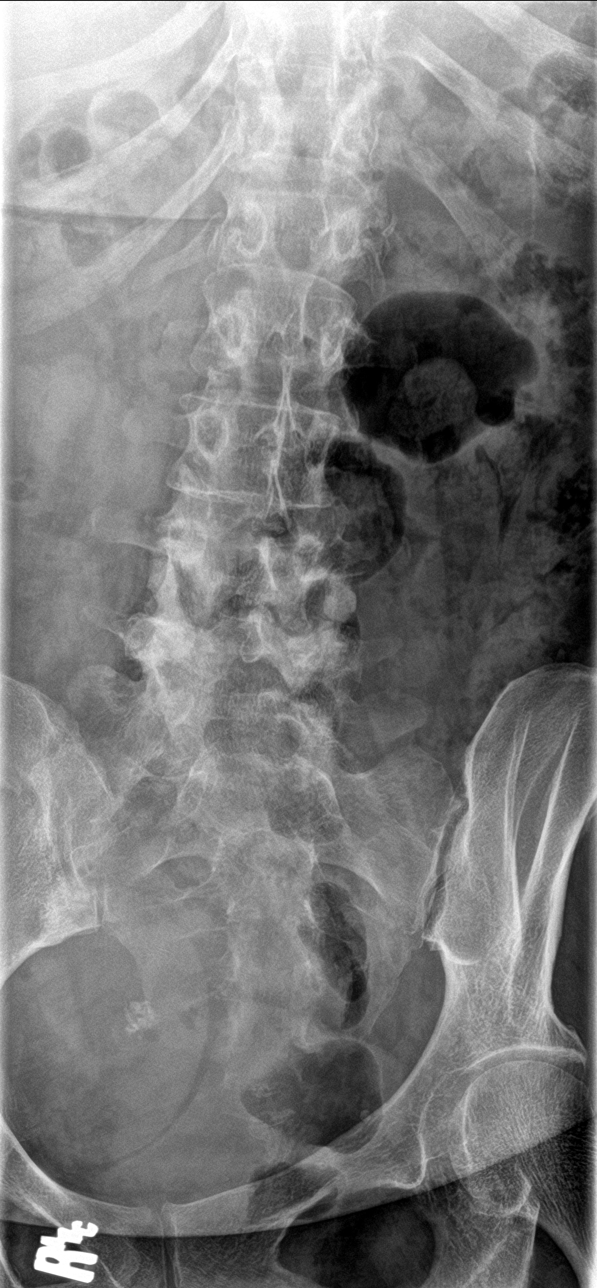
[im 3/5]
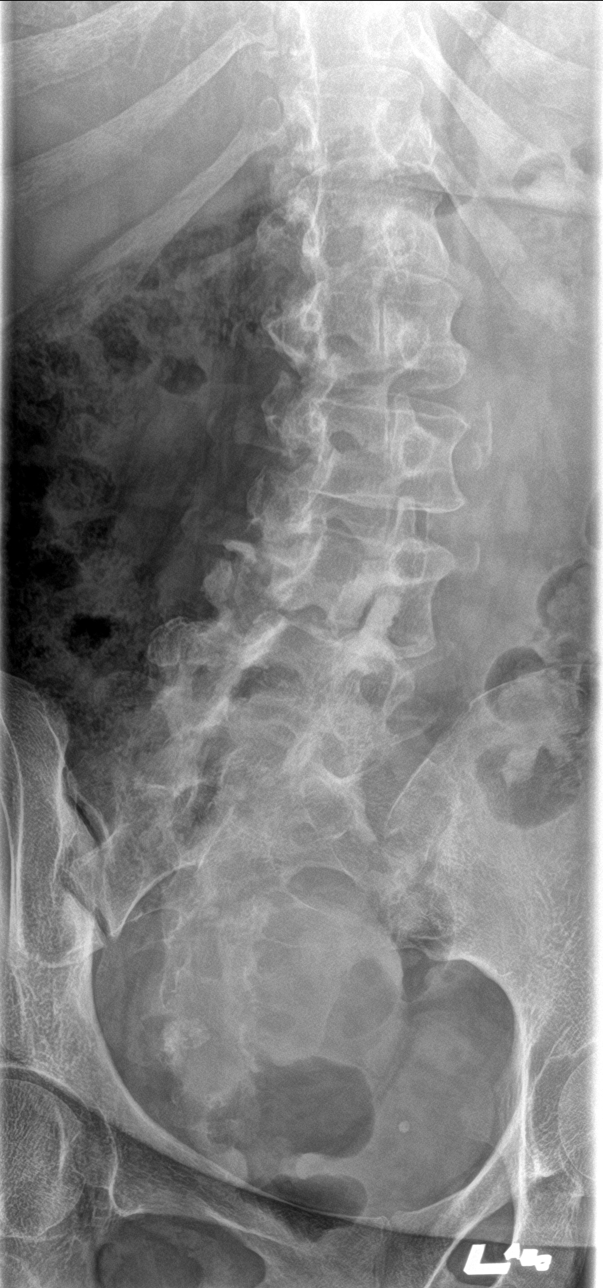
[im 4/5]
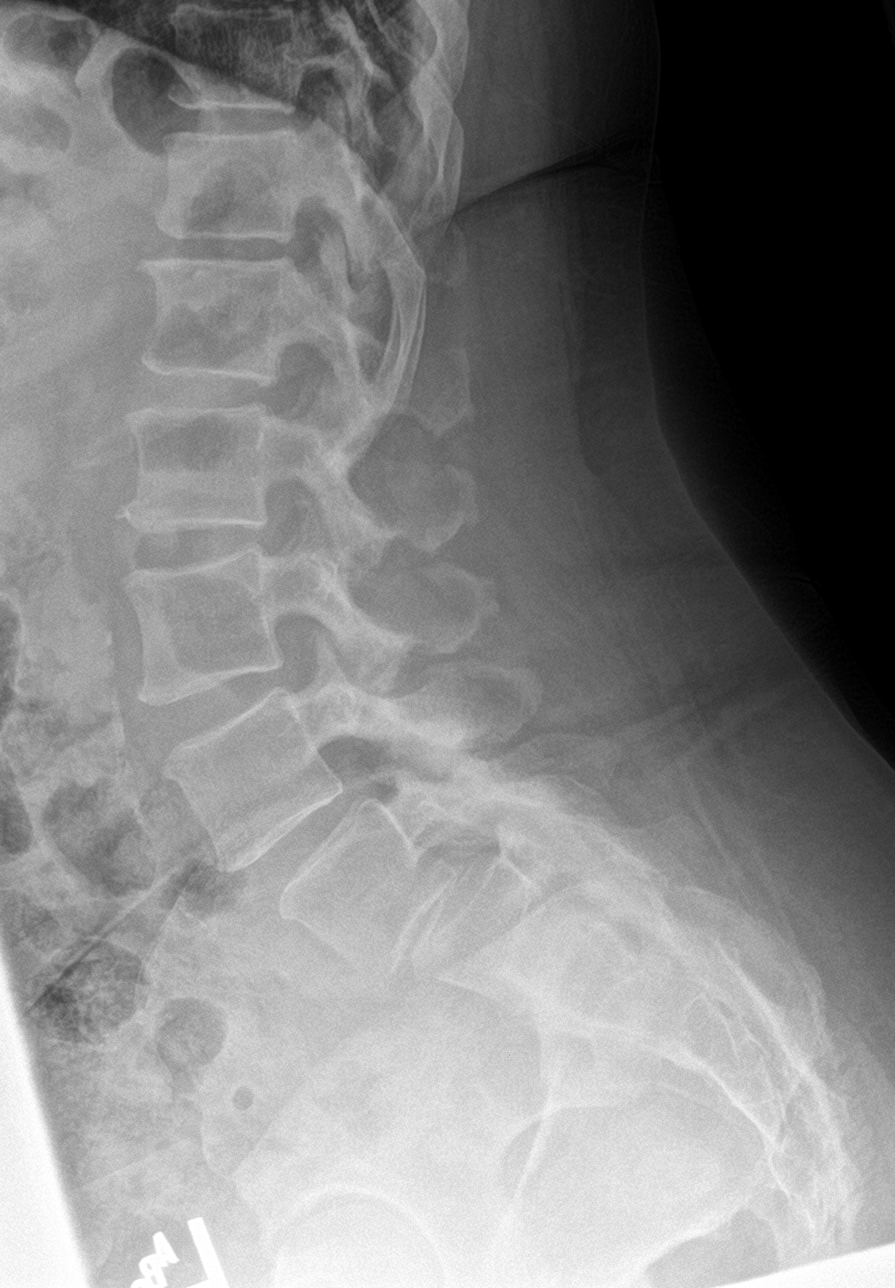
[im 5/5]
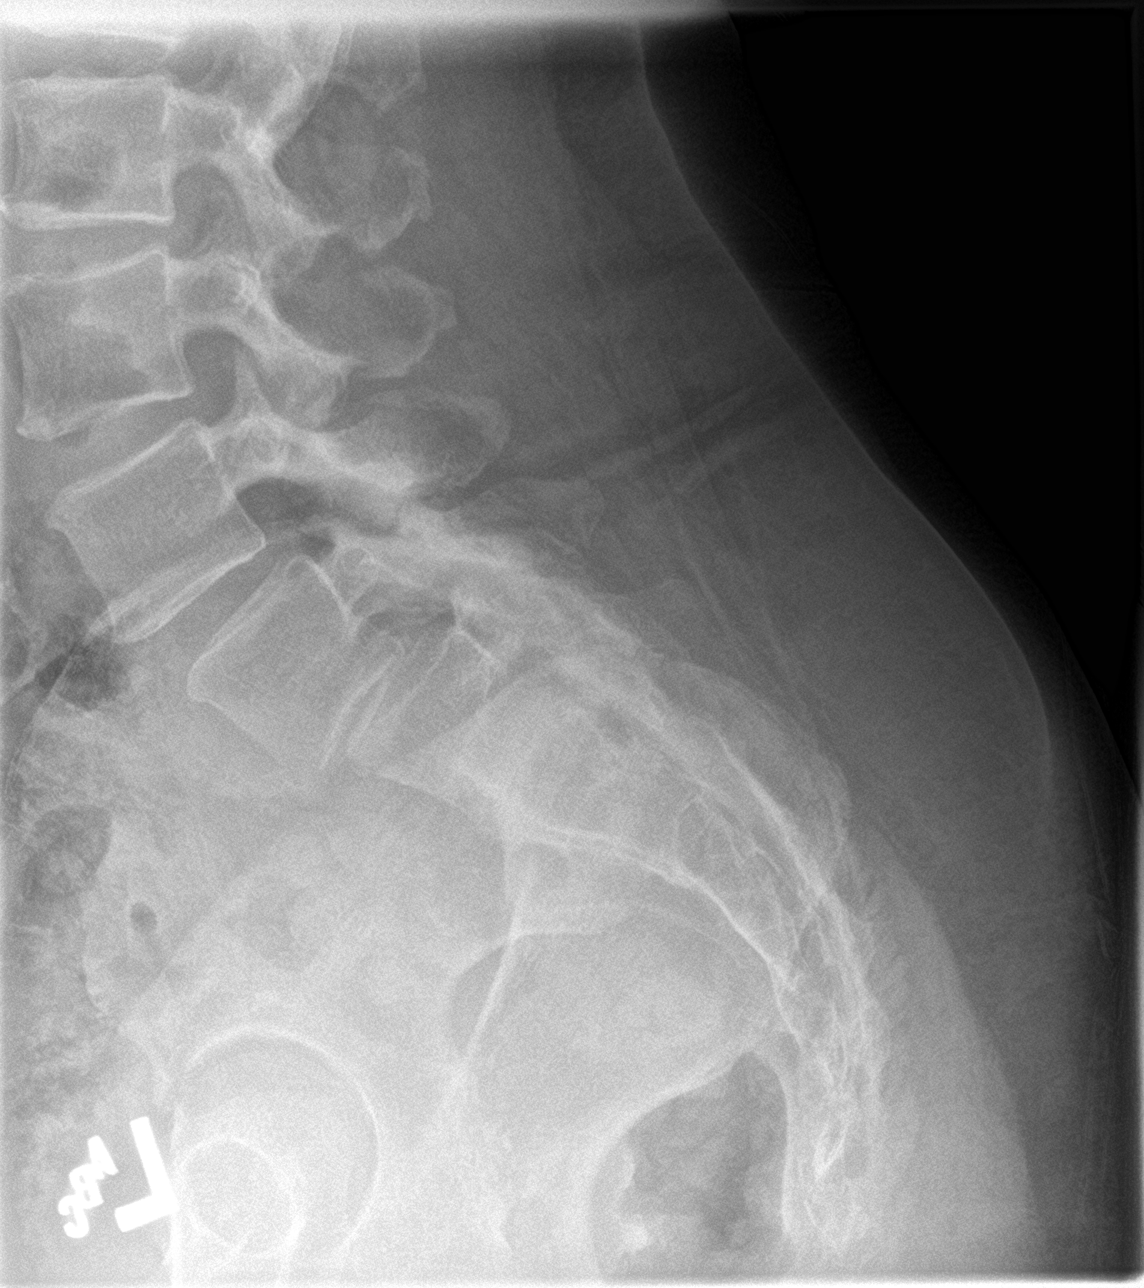

[5 of 5 positions shown; findings below may reference images not displayed]

FINDINGS: Assuming 12 rib pairs, there are small lumbar ribs at what will be
designated L1. Mild levoscoliosis of the lumbar spine. 12 mm
anterolisthesis L5 on S1, limited evaluation of posterior elements
due to positioning on oblique views. Vertebral body heights are
normal. Moderate disc space narrowing at L5-S1. Mild degenerative
spurring at most levels of the lumbar spine. Posterior facet
degenerative change L4 through S1.
IMPRESSION: 1. 12 mm anterolisthesis L5 on S1. Associated moderate degenerative
changes
2. No acute osseous abnormality

## 2022-07-09 ENCOUNTER — Other Ambulatory Visit: Payer: Self-pay | Admitting: Family Medicine

## 2022-07-09 DIAGNOSIS — Z1231 Encounter for screening mammogram for malignant neoplasm of breast: Secondary | ICD-10-CM

## 2022-11-01 ENCOUNTER — Other Ambulatory Visit: Payer: Self-pay | Admitting: Family Medicine

## 2022-11-01 DIAGNOSIS — Z1231 Encounter for screening mammogram for malignant neoplasm of breast: Secondary | ICD-10-CM

## 2022-11-24 ENCOUNTER — Ambulatory Visit
Admission: RE | Admit: 2022-11-24 | Discharge: 2022-11-24 | Disposition: A | Discharge status: Home / Self Care | Payer: Medicaid Other | Source: Ambulatory Visit | Attending: Family Medicine | Admitting: Family Medicine

## 2022-11-24 DIAGNOSIS — Z1231 Encounter for screening mammogram for malignant neoplasm of breast: Secondary | ICD-10-CM | POA: Diagnosis not present

## 2022-12-03 ENCOUNTER — Other Ambulatory Visit: Payer: Self-pay | Admitting: Family Medicine

## 2022-12-03 DIAGNOSIS — R928 Other abnormal and inconclusive findings on diagnostic imaging of breast: Secondary | ICD-10-CM

## 2022-12-07 ENCOUNTER — Ambulatory Visit
Admission: RE | Admit: 2022-12-07 | Discharge: 2022-12-07 | Disposition: A | Discharge status: Home / Self Care | Payer: Medicaid Other | Source: Ambulatory Visit | Attending: Family Medicine | Admitting: Family Medicine

## 2022-12-07 DIAGNOSIS — R928 Other abnormal and inconclusive findings on diagnostic imaging of breast: Secondary | ICD-10-CM | POA: Diagnosis present

## 2022-12-08 ENCOUNTER — Encounter: Payer: Self-pay | Admitting: Family Medicine

## 2022-12-10 ENCOUNTER — Other Ambulatory Visit: Payer: Self-pay | Admitting: Family Medicine

## 2022-12-10 DIAGNOSIS — R928 Other abnormal and inconclusive findings on diagnostic imaging of breast: Secondary | ICD-10-CM

## 2022-12-13 ENCOUNTER — Ambulatory Visit
Admission: RE | Admit: 2022-12-13 | Discharge: 2022-12-13 | Disposition: A | Discharge status: Home / Self Care | Payer: Medicaid Other | Source: Ambulatory Visit | Attending: Family Medicine | Admitting: Family Medicine

## 2022-12-13 DIAGNOSIS — R928 Other abnormal and inconclusive findings on diagnostic imaging of breast: Secondary | ICD-10-CM | POA: Insufficient documentation

## 2022-12-13 HISTORY — PX: BREAST BIOPSY: SHX20

## 2022-12-13 MED ORDER — LIDOCAINE HCL (PF) 1 % IJ SOLN
5.0000 mL | Freq: Once | INTRAMUSCULAR | Status: AC
  Administered 2022-12-13: 5 mL
  Filled 2022-12-13: qty 5

## 2022-12-13 MED ORDER — LIDOCAINE-EPINEPHRINE 1 %-1:100000 IJ SOLN
20.0000 mL | Freq: Once | INTRAMUSCULAR | Status: AC
  Administered 2022-12-13: 20 mL
  Filled 2022-12-13: qty 20

## 2022-12-14 ENCOUNTER — Encounter: Payer: Self-pay | Admitting: *Deleted

## 2022-12-14 LAB — SURGICAL PATHOLOGY

## 2022-12-14 NOTE — Progress Notes (Signed)
Referral recieved from [facility] for benign breast mass.  I called to speak with Jackie Shea and she is currently at work and will call me back later to discuss surgical referral further.

## 2022-12-16 ENCOUNTER — Encounter: Payer: Self-pay | Admitting: *Deleted

## 2022-12-16 NOTE — Progress Notes (Signed)
Spoke with Jackie Shea husband and discussed surgeons in the [facility].  They will give me a call back when they decide who they would like to see.

## 2022-12-21 ENCOUNTER — Encounter: Payer: Self-pay | Admitting: *Deleted

## 2022-12-21 DIAGNOSIS — N649 Disorder of breast, unspecified: Secondary | ICD-10-CM

## 2022-12-21 NOTE — Progress Notes (Signed)
Referral sent to [facility] per patient preference.  Their office will call with an appointment.

## 2022-12-27 ENCOUNTER — Other Ambulatory Visit: Payer: Self-pay | Admitting: Surgery

## 2022-12-27 ENCOUNTER — Encounter: Payer: Self-pay | Admitting: Surgery

## 2022-12-27 ENCOUNTER — Ambulatory Visit (INDEPENDENT_AMBULATORY_CARE_PROVIDER_SITE_OTHER): Payer: Medicaid Other | Admitting: Surgery

## 2022-12-27 VITALS — BP 184/79 | HR 69 | Temp 98.2°F | Ht 65.0 in | Wt 181.0 lb

## 2022-12-27 DIAGNOSIS — R928 Other abnormal and inconclusive findings on diagnostic imaging of breast: Secondary | ICD-10-CM

## 2022-12-27 NOTE — Progress Notes (Signed)
12/27/2022  Reason for Visit: Left breast distortion  Requesting Provider:  Tomasa Hose, MD  History of Present Illness: Jackie Shea is a 62 y.o. female presenting for evaluation of a left breast distortion.  The patient reports that she has not had mammograms in many years because nobody had contacted her about them.  She had a mammogram on 11/24/2022 which showed a left breast distortion in the upper portion of the breast.  Diagnostic mammogram on 12/07/2022 confirmed this being a 12 mm distortion.  She had a biopsy of this area on 12/13/2022 which showed fibrocystic changes and columnar cell changes, but no atypia.  However given the look of the distortion on her mammogram, the findings were thought to be discordant.  In light of this, she was referred to general surgery for further evaluation and consideration of excision of the area.  The patient denies any breast pain, palpable masses, skin changes, nipple changes or drainage.  She does have history in the family of breast cancer including her grandmother and aunt but she is not sure what age they were diagnosed.  She has had 2 children with 2 pregnancies.  Did use birth control at a point many years ago and did not breast-feed.  Past Medical History: Past Medical History:  Diagnosis Date   Hypertension      Past Surgical History: Past Surgical History:  Procedure Laterality Date   BREAST BIOPSY Left 12/13/2022   left breast stereo bx, distortion, x marker, path pending   BREAST BIOPSY Left 12/13/2022   MM LT BREAST BX W LOC DEV 1ST LESION IMAGE BX SPEC STEREO GUIDE 12/13/2022 [facility]    Home Medications: Prior to Admission medications   Medication Sig Start Date End Date Taking? Authorizing Provider  lisinopril (ZESTRIL) 20 MG tablet 20 mg daily. 10/27/17  Yes [provider]    Allergies: No Known Allergies  Social History:  reports that she has quit smoking. She has never used smokeless tobacco. She reports  that she does not drink alcohol and does not use drugs.   Family History: Family History  Problem Relation Age of Onset   Breast cancer Neg Hx     Review of Systems: Review of Systems  Constitutional:  Negative for chills and fever.  HENT:  Negative for hearing loss.   Respiratory:  Negative for shortness of breath.   Cardiovascular:  Negative for chest pain.  Gastrointestinal:  Negative for nausea and vomiting.  Genitourinary:  Negative for dysuria.  Musculoskeletal:  Negative for myalgias.  Skin:  Negative for rash.  Neurological:  Negative for dizziness.  Psychiatric/Behavioral:  Negative for depression.     Physical Exam BP (!) 184/79   Pulse 69   Temp 98.2 F (36.8 C) (Oral)   Ht 5\' 5"  (1.651 m)   Wt 181 lb (82.1 kg)   BMI 30.12 kg/m  CONSTITUTIONAL: No acute distress, well-nourished HEENT:  Normocephalic, atraumatic, extraocular motion intact. NECK: Trachea is midline, and there is no jugular venous distension.  RESPIRATORY:  Lungs are clear, and breath sounds are equal bilaterally. Normal respiratory effort without pathologic use of accessory muscles. CARDIOVASCULAR: Heart is regular without murmurs, gallops, or rubs. BREAST: Left breast status post biopsy in the superior portion of the breast around 12 o'clock position.  Biopsy site is healing well.  There are no palpable masses, other skin changes, or nipple changes.  No left axillary lymphadenopathy.  Right breast without any palpable masses, skin changes, or nipple changes.  No  right axillary lymphadenopathy. MUSCULOSKELETAL:  Normal muscle strength and tone in all four extremities.  No peripheral edema or cyanosis. SKIN: Skin turgor is normal. There are no pathologic skin lesions.  NEUROLOGIC:  Motor and sensation is grossly normal.  Cranial nerves are grossly intact. PSYCH:  Alert and oriented to person, place and time. Affect is normal.  Laboratory Analysis: Left breast biopsy on 12/13/2022: DIAGNOSIS:  A.  BREAST, LEFT MEDIAL SUPERIOR; STEREOTACTIC CORE NEEDLE BIOPSY:  - BENIGN MAMMARY PARENCHYMA DISPLAYING FIBROCYSTIC CHANGES AND COLUMNAR CELL CHANGE WITHOUT ATYPIA.  - NEGATIVE FOR ATYPICAL PROLIFERATIVE BREAST DISEASE.    Imaging: Mammogram on 12/07/2022: FINDINGS: On the diagnostic spot-compression images, the possible distortion noted on the current screening exam persists, although is relatively subtle. The distortion measures approximately 12 mm in size. It projects in the upper slightly inner aspect of the breast. There is no associated mass.   IMPRESSION: 1. 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 Tissue sampling is recommended.   RECOMMENDATION: 1. Stereotactic core needle biopsy of the left breast architectural distortion. This procedure will be scheduled prior to the patient being discharged from the [facility].   I have discussed the findings and recommendations with the patient. If applicable, a reminder letter will be sent to the patient regarding the next appointment.   BI-RADS CATEGORY  4: Suspicious.  Assessment and Plan: This is a 62 y.o. female with a left breast architectural distortion.  - Discussed with the patient the findings on her mammogram as well as biopsy results.  Overall the results have been found to be discordant as the imaging distortion looks more suspicious compared to the biopsy results.  As such, I discussed with patient the potential option for excision of this area to make sure that there were no problems with tissue sampling and also to make sure there is truly nothing else in that area of distortion.  For now, we do not have any cancer diagnosis or any atypical changes on biopsy and as such there is no huge rush but I would still recommend excision more for peace of mind to make sure there is no other issue going on.  The patient is in agreement she would like to proceed to remove this area. - Discussed with the  patient and the plan for a left breast RF tag localized lumpectomy and discussed the two-step procedure at length with her including first placement of RF tag at [facility] breast center followed by the day of surgery to do a lumpectomy using localizer.  Discussed with her the surgery at length including the planned incision, the risks of bleeding, infection, injury to surrounding structures, that this will be an outpatient procedure, use of a breast binder postoperatively, activity restrictions, and she is willing to proceed. - We will schedule the patient for surgery on 01/12/2023.  All of her questions have been answered.  I spent 55 minutes dedicated to the care of this patient on the date of this encounter to include pre-visit review of records, face-to-face time with the patient discussing diagnosis and management, and any post-visit coordination of care.   Melvyn Neth, [facility]

## 2022-12-27 NOTE — Patient Instructions (Signed)
We have spoken today about removing a lump in your breast. This will be done by Dr. Hampton Abbot at [facility].  You will most likely be able to leave the hospital several hours after your surgery. Rarely, a patient needs to stay over night but this is a possibility.  Plan to tenatively be off work for 1 week following the surgery and may return with approximately 2 more weeks of a lifting restriction, no greater than 15 lbs.  Please see your Blue surgery sheet for more information. Our surgery scheduler will call you to look at surgery dates and to go over information.   What is radio frequency localization of the breast?(RFID) RFID tag localization uses radiofrequency technology to accurately pinpoint the tumor. Seeing exactly where the tumor is before surgery helps surgeons more effectively remove the entire tumor and spare surrounding healthy breast tissue.   Lumpectomy A lumpectomy is a form of "breast conserving" or "breast preservation" surgery. It may also be referred to as a partial mastectomy. During a lumpectomy, the portion of the breast that contains the cancerous tumor or breast mass (the lump) is removed. Some normal tissue around the lump may also be removed to make sure all of the tumor has been removed.  LET [facility] CARE PROVIDER KNOW ABOUT: Any allergies you have. All medicines you are taking, including vitamins, herbs, eye drops, creams, and over-the-counter medicines. Previous problems you or members of your family have had with the use of anesthetics. Any blood disorders you have. Previous surgeries you have had. Medical conditions you have. RISKS AND COMPLICATIONS Generally, this is a safe procedure. However, problems can occur and include: Bleeding. Infection. Pain. Temporary swelling. Change in the shape of the breast, particularly if a large portion is removed. BEFORE THE PROCEDURE Ask your health care provider about changing or stopping your regular medicines. This is  especially important if you are taking diabetes medicines or blood thinners. Do not eat or drink anything after midnight on the night before the procedure or as directed by your health care provider. Ask your health care provider if you can take a sip of water with any approved medicines. On the day of surgery, your health care provider will use a mammogram or ultrasound to locate and mark the tumor in your breast. These markings on your breast will show where the cut (incision) will be made. PROCEDURE  An IV tube will be put into one of your veins. You may be given medicine to help you relax before the surgery (sedative). You will be given one of the following: A medicine that numbs the area (local anesthetic). A medicine that makes you fall asleep (general anesthetic). Your health care provider will use a kind of electric scalpel that uses heat to minimize bleeding (electrocautery knife). A curved incision (like a smile or frown) that follows the natural curve of your breast is made, to allow for minimal scarring and better healing. The tumor will be removed with some of the surrounding tissue. This will be sent to the lab for analysis. Your health care provider may also remove your lymph nodes at this time if needed. Sometimes, but not always, a rubber tube called a drain will be surgically inserted into your breast area or armpit to collect excess fluid that may accumulate in the space where the tumor was. This drain is connected to a plastic bulb on the outside of your body. This drain creates suction to help remove the fluid. The incisions will be  closed with stitches (sutures). A bandage may be placed over the incisions. AFTER THE PROCEDURE You will be taken to the recovery area. You will be given medicine for pain. A small rubber drain may be placed in the breast for 2-3 days to prevent a collection of blood (hematoma) from developing in the breast. You will be given instructions on caring  for the drain before you go home. A pressure bandage (dressing) will be applied for 1-2 days to prevent bleeding. Ask your health care provider how to care for your bandage at home.   This information is not intended to replace advice given to you by your health care provider. Make sure you discuss any questions you have with your health care provider.   Document Released: 11/08/2006 Document Revised: 10/18/2014 Document Reviewed: 03/02/2013 Elsevier Interactive Patient Education [employer].

## 2022-12-27 NOTE — H&P (View-Only) (Signed)
12/27/2022  Reason for Visit: Left breast distortion  Requesting Provider:  Ngwe Aycock, MD  History of Present Illness: Jackie Shea is a 62 y.o. female presenting for evaluation of a left breast distortion.  The patient reports that she has not had mammograms in many years because nobody had contacted her about them.  She had a mammogram on 11/24/2022 which showed a left breast distortion in the upper portion of the breast.  Diagnostic mammogram on 12/07/2022 confirmed this being a 12 mm distortion.  She had a biopsy of this area on 12/13/2022 which showed fibrocystic changes and columnar cell changes, but no atypia.  However given the look of the distortion on her mammogram, the findings were thought to be discordant.  In light of this, she was referred to general surgery for further evaluation and consideration of excision of the area.  The patient denies any breast pain, palpable masses, skin changes, nipple changes or drainage.  She does have history in the family of breast cancer including her grandmother and aunt but she is not sure what age they were diagnosed.  She has had 2 children with 2 pregnancies.  Did use birth control at a point many years ago and did not breast-feed.  Past Medical History: Past Medical History:  Diagnosis Date   Hypertension      Past Surgical History: Past Surgical History:  Procedure Laterality Date   BREAST BIOPSY Left 12/13/2022   left breast stereo bx, distortion, x marker, path pending   BREAST BIOPSY Left 12/13/2022   MM LT BREAST BX W LOC DEV 1ST LESION IMAGE BX SPEC STEREO GUIDE 12/13/2022 [facility]    Home Medications: Prior to Admission medications   Medication Sig Start Date End Date Taking? Authorizing Provider  lisinopril (ZESTRIL) 20 MG tablet 20 mg daily. 10/27/17  Yes [provider]    Allergies: No Known Allergies  Social History:  reports that she has quit smoking. She has never used smokeless tobacco. She reports  that she does not drink alcohol and does not use drugs.   Family History: Family History  Problem Relation Age of Onset   Breast cancer Neg Hx     Review of Systems: Review of Systems  Constitutional:  Negative for chills and fever.  HENT:  Negative for hearing loss.   Respiratory:  Negative for shortness of breath.   Cardiovascular:  Negative for chest pain.  Gastrointestinal:  Negative for nausea and vomiting.  Genitourinary:  Negative for dysuria.  Musculoskeletal:  Negative for myalgias.  Skin:  Negative for rash.  Neurological:  Negative for dizziness.  Psychiatric/Behavioral:  Negative for depression.     Physical Exam BP (!) 184/79   Pulse 69   Temp 98.2 F (36.8 C) (Oral)   Ht 5' 5" (1.651 m)   Wt 181 lb (82.1 kg)   BMI 30.12 kg/m  CONSTITUTIONAL: No acute distress, well-nourished HEENT:  Normocephalic, atraumatic, extraocular motion intact. NECK: Trachea is midline, and there is no jugular venous distension.  RESPIRATORY:  Lungs are clear, and breath sounds are equal bilaterally. Normal respiratory effort without pathologic use of accessory muscles. CARDIOVASCULAR: Heart is regular without murmurs, gallops, or rubs. BREAST: Left breast status post biopsy in the superior portion of the breast around 12 o'clock position.  Biopsy site is healing well.  There are no palpable masses, other skin changes, or nipple changes.  No left axillary lymphadenopathy.  Right breast without any palpable masses, skin changes, or nipple changes.  No   right axillary lymphadenopathy. MUSCULOSKELETAL:  Normal muscle strength and tone in all four extremities.  No peripheral edema or cyanosis. SKIN: Skin turgor is normal. There are no pathologic skin lesions.  NEUROLOGIC:  Motor and sensation is grossly normal.  Cranial nerves are grossly intact. PSYCH:  Alert and oriented to person, place and time. Affect is normal.  Laboratory Analysis: Left breast biopsy on 12/13/2022: DIAGNOSIS:  A.  BREAST, LEFT MEDIAL SUPERIOR; STEREOTACTIC CORE NEEDLE BIOPSY:  - BENIGN MAMMARY PARENCHYMA DISPLAYING FIBROCYSTIC CHANGES AND COLUMNAR CELL CHANGE WITHOUT ATYPIA.  - NEGATIVE FOR ATYPICAL PROLIFERATIVE BREAST DISEASE.    Imaging: Mammogram on 12/07/2022: FINDINGS: On the diagnostic spot-compression images, the possible distortion noted on the current screening exam persists, although is relatively subtle. The distortion measures approximately 12 mm in size. It projects in the upper slightly inner aspect of the breast. There is no associated mass.   IMPRESSION: 1. 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 Tissue sampling is recommended.   RECOMMENDATION: 1. Stereotactic core needle biopsy of the left breast architectural distortion. This procedure will be scheduled prior to the patient being discharged from the [facility].   I have discussed the findings and recommendations with the patient. If applicable, a reminder letter will be sent to the patient regarding the next appointment.   BI-RADS CATEGORY  4: Suspicious.  Assessment and Plan: This is a 62 y.o. female with a left breast architectural distortion.  - Discussed with the patient the findings on her mammogram as well as biopsy results.  Overall the results have been found to be discordant as the imaging distortion looks more suspicious compared to the biopsy results.  As such, I discussed with patient the potential option for excision of this area to make sure that there were no problems with tissue sampling and also to make sure there is truly nothing else in that area of distortion.  For now, we do not have any cancer diagnosis or any atypical changes on biopsy and as such there is no huge rush but I would still recommend excision more for peace of mind to make sure there is no other issue going on.  The patient is in agreement she would like to proceed to remove this area. - Discussed with the  patient and the plan for a left breast RF tag localized lumpectomy and discussed the two-step procedure at length with her including first placement of RF tag at [place] [facility] followed by the day of surgery to do a lumpectomy using localizer.  Discussed with her the surgery at length including the planned incision, the risks of bleeding, infection, injury to surrounding structures, that this will be an outpatient procedure, use of a breast binder postoperatively, activity restrictions, and she is willing to proceed. - We will schedule the patient for surgery on 01/12/2023.  All of her questions have been answered.  I spent 55 minutes dedicated to the care of this patient on the date of this encounter to include pre-visit review of records, face-to-face time with the patient discussing diagnosis and management, and any post-visit coordination of care.   Lilyahna Sirmon Luis Rishon Thilges, MD [facility]    

## 2022-12-28 ENCOUNTER — Telehealth: Payer: Self-pay | Admitting: Surgery

## 2022-12-28 NOTE — Telephone Encounter (Signed)
Patient has been advised of Pre-Admission date/time, and Surgery date at [facility]  Surgery Date: 01/12/23 Preadmission Testing Date: 01/03/23 (phone 1p-4p)  Patient has been made aware to call [phone], between 1-3:00pm the day before surgery, to find out what time to arrive for surgery.

## 2023-01-03 ENCOUNTER — Other Ambulatory Visit: Payer: Self-pay

## 2023-01-03 ENCOUNTER — Ambulatory Visit
Admission: RE | Admit: 2023-01-03 | Discharge: 2023-01-03 | Disposition: A | Discharge status: Home / Self Care | Payer: Medicaid Other | Source: Ambulatory Visit | Attending: Surgery | Admitting: Surgery

## 2023-01-03 ENCOUNTER — Encounter
Admission: RE | Admit: 2023-01-03 | Discharge: 2023-01-03 | Disposition: A | Discharge status: Home / Self Care | Payer: Medicaid Other | Source: Ambulatory Visit | Attending: Surgery | Admitting: Surgery

## 2023-01-03 VITALS — Ht 65.0 in | Wt 181.0 lb

## 2023-01-03 DIAGNOSIS — R928 Other abnormal and inconclusive findings on diagnostic imaging of breast: Secondary | ICD-10-CM | POA: Diagnosis not present

## 2023-01-03 DIAGNOSIS — I10 Essential (primary) hypertension: Secondary | ICD-10-CM

## 2023-01-03 HISTORY — PX: MM LT RADIO FREQUENCY TAG EA ADD LESION LOC MAMMO GUIDE: IMG825

## 2023-01-03 HISTORY — DX: Unspecified osteoarthritis, unspecified site: M19.90

## 2023-01-03 MED ORDER — LIDOCAINE HCL (PF) 1 % IJ SOLN
10.0000 mL | Freq: Once | INTRAMUSCULAR | Status: AC
  Administered 2023-01-03: 10 mL

## 2023-01-03 NOTE — Patient Instructions (Addendum)
Your procedure is scheduled on: Wednesday 01/12/23 To find out your arrival time, please call [phone] between [place] on:  Tuesday 01/11/23 Report to the Registration Desk on the 1st floor of the [facility]. Valet parking is available.  If your arrival time is 6:00 am, do not arrive before that time as the [facility] entrance doors do not open until 6:00 am.  REMEMBER: Instructions that are not followed completely may result in serious medical risk, up to and including death; or upon the discretion of your surgeon and anesthesiologist your surgery may need to be rescheduled.  Do not eat food after midnight the night before surgery.  No gum chewing or hard candies.  You may however, drink CLEAR liquids up to 2 hours before you are scheduled to arrive for your surgery. Do not drink anything within 2 hours of your scheduled arrival time.  Clear liquids include: - water  - apple juice without pulp - gatorade (not RED colors) - black coffee or tea (Do NOT add milk or creamers to the coffee or tea) Do NOT drink anything that is not on this list.  Type 1 and Type 2 diabetics should only drink water.  One week prior to surgery: Stop Anti-inflammatories (NSAIDS) such as Advil, Aleve, Ibuprofen, Motrin, Naproxen, Naprosyn and Aspirin based products such as Excedrin, Goody's Powder, BC Powder. You may however, continue to take Tylenol if needed for pain up until the day of surgery.  Stop ANY OVER THE COUNTER supplements until after surgery.  Continue taking all prescribed medications.   TAKE ONLY THESE MEDICATIONS THE MORNING OF SURGERY WITH A SIP OF WATER:  NONE  No Alcohol for 24 hours before or after surgery.  No Smoking including e-cigarettes for 24 hours before surgery.  No chewable tobacco products for at least 6 hours before surgery.  No nicotine patches on the day of surgery.  Do not use any "recreational" drugs for at least a week (preferably 2 weeks) before your  surgery.  Please be advised that the combination of cocaine and anesthesia may have negative outcomes, up to and including death. If you test positive for cocaine, your surgery will be cancelled.  On the morning of surgery brush your teeth with toothpaste and water, you may rinse your mouth with mouthwash if you wish. Do not swallow any toothpaste or mouthwash.  Use CHG Soap or wipes as directed on instruction sheet.  Do not wear lotions, powders, or perfumes. NO DEODORANT  Do not shave body hair from the neck down 48 hours before surgery.  Wear comfortable clothing (specific to your surgery type) to the hospital.  Do not wear jewelry, make-up, hairpins, clips or nail polish.  Contact lenses, hearing aids and dentures may not be worn into surgery.  Do not bring valuables to the hospital. [facility] is not responsible for any missing/lost belongings or valuables.   Notify your doctor if there is any change in your medical condition (cold, fever, infection).  If you are being discharged the day of surgery, you will not be allowed to drive home. You will need a responsible individual to drive you home and stay with you for 24 hours after surgery.   If you are taking public transportation, you will need to have a responsible individual with you.  If you are being admitted to the hospital overnight, leave your suitcase in the car. After surgery it may be brought to your room.  In case of increased patient census,  it may be necessary for you, the patient, to continue your postoperative care in the Same Day Surgery department.  After surgery, you can help prevent lung complications by doing breathing exercises.  Take deep breaths and cough every 1-2 hours. Your doctor may order a device called an Incentive Spirometer to help you take deep breaths. When coughing or sneezing, hold a pillow firmly against your incision with both hands. This is called "splinting." Doing this helps protect  your incision. It also decreases belly discomfort.  Surgery Visitation Policy:  Patients undergoing a surgery or procedure may have two family members or support persons with them as long as the person is not COVID-19 positive or experiencing its symptoms.   Inpatient Visitation:    Visiting hours are 7 a.m. to 8 p.m. Up to four visitors are allowed at one time in a patient room. The visitors may rotate out with other people during the day. One designated support person (adult) may remain overnight.  Please call the [place] Dept. at [phone] if you have any questions about these instructions.     Preparing for Surgery with CHLORHEXIDINE GLUCONATE (CHG) Soap  Chlorhexidine Gluconate (CHG) Soap  o An antiseptic cleaner that kills germs and bonds with the skin to continue killing germs even after washing  o Used for showering the night before surgery and morning of surgery  Before surgery, you can play an important role by reducing the number of germs on your skin.  CHG (Chlorhexidine gluconate) soap is an antiseptic cleanser which kills germs and bonds with the skin to continue killing germs even after washing.  Please do not use if you have an allergy to CHG or antibacterial soaps. If your skin becomes reddened/irritated stop using the CHG.  1. Shower the NIGHT BEFORE SURGERY and the MORNING OF SURGERY with CHG soap.  2. If you choose to wash your hair, wash your hair first as usual with your normal shampoo.  3. After shampooing, rinse your hair and body thoroughly to remove the shampoo.  4. Use CHG as you would any other liquid soap. You can apply CHG directly to the skin and wash gently with a scrungie or a clean washcloth.  5. Apply the CHG soap to your body only from the neck down. Do not use on open wounds or open sores. Avoid contact with your eyes, ears, mouth, and genitals (private parts). Wash face and genitals (private parts) with your normal  soap.  6. Wash thoroughly, paying special attention to the area where your surgery will be performed.  7. Thoroughly rinse your body with warm water.  8. Do not shower/wash with your normal soap after using and rinsing off the CHG soap.  9. Pat yourself dry with a clean towel.  10. Wear clean pajamas to bed the night before surgery.  12. Place clean sheets on your bed the night of your first shower and do not sleep with pets.  13. Shower again with the CHG soap on the day of surgery prior to arriving at the hospital.  14. Do not apply any deodorants/lotions/powders.  15. Please wear clean clothes to the hospital.

## 2023-01-07 ENCOUNTER — Emergency Department
Admission: EM | Admit: 2023-01-07 | Discharge: 2023-01-07 | Disposition: A | Discharge status: Home / Self Care | Payer: Medicaid Other | Attending: Emergency Medicine | Admitting: Emergency Medicine

## 2023-01-07 ENCOUNTER — Encounter: Payer: Self-pay | Admitting: Intensive Care

## 2023-01-07 ENCOUNTER — Inpatient Hospital Stay: Admission: RE | Admit: 2023-01-07 | Payer: Medicaid Other | Source: Ambulatory Visit

## 2023-01-07 ENCOUNTER — Encounter: Payer: Self-pay | Admitting: Urgent Care

## 2023-01-07 ENCOUNTER — Other Ambulatory Visit: Payer: Self-pay

## 2023-01-07 ENCOUNTER — Emergency Department: Payer: Medicaid Other

## 2023-01-07 DIAGNOSIS — R1084 Generalized abdominal pain: Secondary | ICD-10-CM | POA: Diagnosis not present

## 2023-01-07 DIAGNOSIS — I10 Essential (primary) hypertension: Secondary | ICD-10-CM | POA: Insufficient documentation

## 2023-01-07 DIAGNOSIS — R197 Diarrhea, unspecified: Secondary | ICD-10-CM | POA: Diagnosis not present

## 2023-01-07 DIAGNOSIS — N2889 Other specified disorders of kidney and ureter: Secondary | ICD-10-CM

## 2023-01-07 DIAGNOSIS — D259 Leiomyoma of uterus, unspecified: Secondary | ICD-10-CM

## 2023-01-07 DIAGNOSIS — R1031 Right lower quadrant pain: Secondary | ICD-10-CM | POA: Diagnosis present

## 2023-01-07 HISTORY — DX: Unspecified ovarian cyst, left side: N83.202

## 2023-01-07 HISTORY — DX: Leiomyoma of uterus, unspecified: D25.9

## 2023-01-07 HISTORY — DX: Diaphragmatic hernia without obstruction or gangrene: K44.9

## 2023-01-07 HISTORY — DX: Atherosclerosis of aorta: I70.0

## 2023-01-07 HISTORY — DX: Other specified disorders of kidney and ureter: N28.89

## 2023-01-07 LAB — URINALYSIS, W/ REFLEX TO CULTURE (INFECTION SUSPECTED)
Bilirubin Urine: NEGATIVE
Glucose, UA: NEGATIVE mg/dL
Ketones, ur: NEGATIVE mg/dL
Nitrite: NEGATIVE
Protein, ur: NEGATIVE mg/dL
Specific Gravity, Urine: 1.016 (ref 1.005–1.030)
pH: 5 (ref 5.0–8.0)

## 2023-01-07 LAB — TROPONIN I (HIGH SENSITIVITY)
Troponin I (High Sensitivity): 3 ng/L (ref <18)
Troponin I (High Sensitivity): 3 ng/L (ref <18)

## 2023-01-07 LAB — CBC WITH DIFFERENTIAL/PLATELET
Abs Immature Granulocytes: 0.02 10*3/uL (ref 0.00–0.07)
Basophils Absolute: 0 10*3/uL (ref 0.0–0.1)
Basophils Relative: 0 %
Eosinophils Absolute: 0.1 10*3/uL (ref 0.0–0.5)
Eosinophils Relative: 2 %
HCT: 42.9 % (ref 36.0–46.0)
Hemoglobin: 13.9 g/dL (ref 12.0–15.0)
Immature Granulocytes: 0 %
Lymphocytes Relative: 41 %
Lymphs Abs: 2.3 10*3/uL (ref 0.7–4.0)
MCH: 24 pg — ABNORMAL LOW (ref 26.0–34.0)
MCHC: 32.4 g/dL (ref 30.0–36.0)
MCV: 74.1 fL — ABNORMAL LOW (ref 80.0–100.0)
Monocytes Absolute: 0.6 10*3/uL (ref 0.1–1.0)
Monocytes Relative: 10 %
Neutro Abs: 2.8 10*3/uL (ref 1.7–7.7)
Neutrophils Relative %: 47 %
Platelets: 324 10*3/uL (ref 150–400)
RBC: 5.79 MIL/uL — ABNORMAL HIGH (ref 3.87–5.11)
RDW: 16.7 % — ABNORMAL HIGH (ref 11.5–15.5)
WBC: 5.8 10*3/uL (ref 4.0–10.5)
nRBC: 0 % (ref 0.0–0.2)

## 2023-01-07 LAB — COMPREHENSIVE METABOLIC PANEL
ALT: 16 U/L (ref 0–44)
AST: 19 U/L (ref 15–41)
Albumin: 3.7 g/dL (ref 3.5–5.0)
Alkaline Phosphatase: 103 U/L (ref 38–126)
Anion gap: 9 (ref 5–15)
BUN: 22 mg/dL — ABNORMAL HIGH (ref 6–20)
CO2: 24 mmol/L (ref 22–32)
Calcium: 9.3 mg/dL (ref 8.9–10.3)
Chloride: 107 mmol/L (ref 98–111)
Creatinine, Ser: 0.97 mg/dL (ref 0.44–1.00)
GFR, Estimated: >60 mL/min (ref >60)
Glucose, Bld: 110 mg/dL — ABNORMAL HIGH (ref 70–99)
Potassium: 3.4 mmol/L — ABNORMAL LOW (ref 3.5–5.1)
Sodium: 140 mmol/L (ref 135–145)
Total Bilirubin: 0.5 mg/dL (ref 0.3–1.2)
Total Protein: 7.4 g/dL (ref 6.5–8.1)

## 2023-01-07 LAB — LACTIC ACID, PLASMA
Lactic Acid, Venous: 1 mmol/L (ref 0.5–1.9)
Lactic Acid, Venous: 1.3 mmol/L (ref 0.5–1.9)

## 2023-01-07 LAB — LIPASE, BLOOD: Lipase: 25 U/L (ref 11–51)

## 2023-01-07 MED ORDER — IOHEXOL 300 MG/ML  SOLN
100.0000 mL | Freq: Once | INTRAMUSCULAR | Status: AC | PRN
  Administered 2023-01-07: 100 mL via INTRAVENOUS

## 2023-01-07 MED ORDER — SODIUM CHLORIDE 0.9 % IV BOLUS
1000.0000 mL | Freq: Once | INTRAVENOUS | Status: AC
  Administered 2023-01-07: 1000 mL via INTRAVENOUS

## 2023-01-07 NOTE — ED Notes (Signed)
ED Provider at bedside. 

## 2023-01-07 NOTE — ED Notes (Signed)
Pt verbalized understanding of discharge instructions. Opportunity for questions provided.  

## 2023-01-07 NOTE — Discharge Instructions (Addendum)
Please use over-the-counter Imodium (loperamide) for any continued diarrhea

## 2023-01-07 NOTE — ED Triage Notes (Signed)
Patient c/o abdominal pain, diarrhea, and headache that started this AM

## 2023-01-07 NOTE — ED Provider Notes (Signed)
[facility] Provider Note   Event Date/Time   First MD Initiated Contact with Patient 01/07/23 [phone]     (approximate) History  Abdominal Pain and Diarrhea  HPI Jackie Shea is a 62 y.o. female with a stated past medical history of hypertension presents for abdominal pain, diarrhea, and fatigue that began this morning.  Patient states that she had food out of the ordinary yesterday at work and is concerned that this may be where her abdominal pain and diarrhea is coming from.  Patient describes 5/10, nonradiating, aching bilateral lower quadrant abdominal pain that resolved after 1 episode of nonbloody diarrhea today.  Patient states that this abdominal pain resolved and has not recurred after this bowel movement. ROS: Patient currently denies any vision changes, tinnitus, difficulty speaking, facial droop, sore throat, chest pain, shortness of breath, nausea/vomiting, dysuria, or weakness/numbness/paresthesias in any extremity   Physical Exam  Triage Vital Signs: ED Triage Vitals  Enc Vitals Group     BP 01/07/23 0934 132/73     Pulse Rate 01/07/23 0934 81     Resp 01/07/23 0934 17     Temp 01/07/23 0934 98.3 F (36.8 C)     Temp Source 01/07/23 0934 Oral     SpO2 01/07/23 0934 94 %     Weight 01/07/23 0937 181 lb (82.1 kg)     Height 01/07/23 0937 5\' 5"  (1.651 m)     Head Circumference --      Peak Flow --      Pain Score 01/07/23 0937 5     Pain Loc --      Pain Edu? --      Excl. in [place]? --    Most recent vital signs: Vitals:   01/07/23 0934  BP: 132/73  Pulse: 81  Resp: 17  Temp: 98.3 F (36.8 C)  SpO2: 94%   General: Awake, oriented x4. CV:  Good peripheral perfusion.  Resp:  Normal effort.  Abd:  No distention.  Mild tenderness to palpation in bilateral lower abdominal quadrants Other:  Middle-aged obese African-American female laying in bed in no acute distress ED Results / Procedures / Treatments  Labs (all labs ordered are listed,  but only abnormal results are displayed) Labs Reviewed  COMPREHENSIVE METABOLIC PANEL - Abnormal; Notable for the following components:      Result Value   Potassium 3.4 (*)    Glucose, Bld 110 (*)    BUN 22 (*)    All other components within normal limits  CBC WITH DIFFERENTIAL/PLATELET - Abnormal; Notable for the following components:   RBC 5.79 (*)    MCV 74.1 (*)    MCH 24.0 (*)    RDW 16.7 (*)    All other components within normal limits  URINALYSIS, W/ REFLEX TO CULTURE (INFECTION SUSPECTED) - Abnormal; Notable for the following components:   Color, Urine YELLOW (*)    APPearance CLEAR (*)    Hgb urine dipstick MODERATE (*)    Leukocytes,Ua TRACE (*)    Bacteria, UA FEW (*)    All other components within normal limits  LIPASE, BLOOD  LACTIC ACID, PLASMA  LACTIC ACID, PLASMA  TROPONIN I (HIGH SENSITIVITY)  TROPONIN I (HIGH SENSITIVITY)   RADIOLOGY ED MD interpretation: CT of the abdomen and pelvis with IV contrast interpreted independently by me and shows no evidence of acute abnormalities.  There is incidentally found renal cyst and uterine fibroids -Agree with radiology assessment Official radiology report(s): CT Abdomen  Pelvis W Contrast  Result Date: 01/07/2023 CLINICAL DATA:  Abdominal pain, diarrhea, and headache beginning this morning EXAM: CT ABDOMEN AND PELVIS WITH CONTRAST TECHNIQUE: Multidetector CT imaging of the abdomen and pelvis was performed using the standard protocol following bolus administration of intravenous contrast. RADIATION DOSE REDUCTION: This exam was performed according to the departmental dose-optimization program which includes automated exposure control, adjustment of the mA and/or kV according to patient size and/or use of iterative reconstruction technique. CONTRAST:  17mL OMNIPAQUE IOHEXOL 300 MG/ML  SOLN COMPARISON:  None Available. FINDINGS: Lower chest: Lung bases clear Hepatobiliary: Small hypervascular lesion lateral segment LEFT lobe  liver 11 mm diameter likely atypical hemangioma. Gallbladder contracted. Liver otherwise normal. Pancreas: Normal appearance Spleen: Normal appearance Adrenals/Urinary Tract: Adrenal glands normal appearance. Tiny RIGHT renal cysts less than 1 cm diameter; no follow-up imaging recommended. Complex lesion anterior aspect of inferior LEFT kidney, 15 x 11 mm image 38, demonstrates partial enhancement and washout, cystic renal neoplasm not excluded. No urinary tract calcification, hydronephrosis, or hydroureter. Bladder unremarkable. Stomach/Bowel: Normal appendix. Small hiatal hernia. Stomach and bowel loops otherwise unremarkable. Vascular/Lymphatic: Atherosclerotic calcifications aorta and iliac arteries without aneurysm. No adenopathy. Reproductive: RIGHT ovary unremarkable. Two cysts within LEFT ovary, larger 2.5 cm diameter; no follow-up imaging recommended. Enlarged nodular uterus containing several large leiomyomata measuring up to 6.7 cm and 6.5 cm. Other: No free air or free fluid. No hernia or inflammatory process. 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 Enlarged nodular uterus containing several large leiomyomata measuring up to 6.7 cm and 6.5 cm. Small hiatal hernia. 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 Aortic Atherosclerosis (ICD10-I70.0). Electronically Signed   By: Lavonia Dana M.D.   On: 01/07/2023 11:33   PROCEDURES: Critical Care performed: No .1-3 Lead EKG Interpretation  Performed by: Naaman Plummer, MD Authorized by: Naaman Plummer, MD     Interpretation: normal     ECG rate:  71   ECG rate assessment: normal     Rhythm: sinus rhythm     Ectopy: none     Conduction: normal    MEDICATIONS ORDERED  IN ED: Medications  sodium chloride 0.9 % bolus 1,000 mL (0 mLs Intravenous Stopped 01/07/23 1155)  iohexol (OMNIPAQUE) 300 MG/ML solution 100 mL (100 mLs Intravenous Contrast Given 01/07/23 1112)   IMPRESSION / MDM / ASSESSMENT AND PLAN / ED COURSE  I reviewed the triage vital signs and the nursing notes.                             The patient is on the cardiac monitor to evaluate for evidence of arrhythmia and/or significant heart rate changes. Patient's presentation is most consistent with acute presentation with potential threat to life or bodily function. This patient presents with diarrhea consistent with likely viral enteritis. Doubt acute bacterial diarrhea. Considered, but think unlikely, partial SBO, appendicitis, diverticulitis, other intraabdominal infection. Low suspicion for secondary causes of diarrhea such as hyperadrenergic state, pheo, adrenal crisis, hyperthyroidism, or sepsis. Doubt antibiotic associated diarrhea.  Plan: PO rehydration, reassess, discharge with OTC antidiarrheal meds//short course antibiotics  Dispo: Discharge home with PCP follow-up and strict return precautions   FINAL CLINICAL IMPRESSION(S) / ED DIAGNOSES   Final diagnoses:  Generalized abdominal pain  Diarrhea, unspecified type   Rx / DC Orders   ED Discharge Orders     None      Note:  This document was prepared using Dragon voice recognition software and may include unintentional dictation errors.   Naaman Plummer, MD 01/07/23 [phone]

## 2023-01-12 ENCOUNTER — Encounter: Payer: Self-pay | Admitting: Surgery

## 2023-01-12 ENCOUNTER — Other Ambulatory Visit: Payer: Self-pay

## 2023-01-12 ENCOUNTER — Ambulatory Visit
Admission: RE | Admit: 2023-01-12 | Discharge: 2023-01-12 | Disposition: A | Discharge status: Home / Self Care | Payer: Medicaid Other | Source: Ambulatory Visit | Attending: Surgery | Admitting: Surgery

## 2023-01-12 ENCOUNTER — Ambulatory Visit: Payer: Medicaid Other | Admitting: Urgent Care

## 2023-01-12 ENCOUNTER — Ambulatory Visit: Payer: Medicaid Other | Admitting: Anesthesiology

## 2023-01-12 ENCOUNTER — Encounter
Admission: RE | Disposition: A | Discharge status: Home / Self Care | Payer: Self-pay | Source: Ambulatory Visit | Attending: Surgery

## 2023-01-12 DIAGNOSIS — I2510 Atherosclerotic heart disease of native coronary artery without angina pectoris: Secondary | ICD-10-CM | POA: Diagnosis not present

## 2023-01-12 DIAGNOSIS — N6032 Fibrosclerosis of left breast: Secondary | ICD-10-CM | POA: Insufficient documentation

## 2023-01-12 DIAGNOSIS — Z09 Encounter for follow-up examination after completed treatment for conditions other than malignant neoplasm: Secondary | ICD-10-CM | POA: Insufficient documentation

## 2023-01-12 DIAGNOSIS — I10 Essential (primary) hypertension: Secondary | ICD-10-CM | POA: Diagnosis not present

## 2023-01-12 DIAGNOSIS — N6321 Unspecified lump in the left breast, upper outer quadrant: Secondary | ICD-10-CM | POA: Diagnosis not present

## 2023-01-12 DIAGNOSIS — Z87891 Personal history of nicotine dependence: Secondary | ICD-10-CM | POA: Insufficient documentation

## 2023-01-12 DIAGNOSIS — N6320 Unspecified lump in the left breast, unspecified quadrant: Secondary | ICD-10-CM | POA: Diagnosis present

## 2023-01-12 DIAGNOSIS — N641 Fat necrosis of breast: Secondary | ICD-10-CM | POA: Insufficient documentation

## 2023-01-12 DIAGNOSIS — R928 Other abnormal and inconclusive findings on diagnostic imaging of breast: Secondary | ICD-10-CM

## 2023-01-12 DIAGNOSIS — Z803 Family history of malignant neoplasm of breast: Secondary | ICD-10-CM | POA: Diagnosis not present

## 2023-01-12 HISTORY — PX: BREAST LUMPECTOMY WITH RADIOFREQUENCY TAG IDENTIFICATION: SHX6884

## 2023-01-12 LAB — CBC
HCT: 39.3 % (ref 36.0–46.0)
Hemoglobin: 12.4 g/dL (ref 12.0–15.0)
MCH: 23 pg — ABNORMAL LOW (ref 26.0–34.0)
MCHC: 31.6 g/dL (ref 30.0–36.0)
MCV: 72.8 fL — ABNORMAL LOW (ref 80.0–100.0)
Platelets: 273 10*3/uL (ref 150–400)
RBC: 5.4 MIL/uL — ABNORMAL HIGH (ref 3.87–5.11)
RDW: 16.1 % — ABNORMAL HIGH (ref 11.5–15.5)
WBC: 5.1 10*3/uL (ref 4.0–10.5)
nRBC: 0 % (ref 0.0–0.2)

## 2023-01-12 SURGERY — BREAST LUMPECTOMY WITH RADIOFREQUENCY TAG IDENTIFICATION
Anesthesia: General | Site: Breast | Laterality: Left

## 2023-01-12 MED ORDER — ACETAMINOPHEN 500 MG PO TABS: 1000.0000 mg | ORAL_TABLET | Freq: Four times a day (QID) | ORAL | Status: AC | PRN

## 2023-01-12 MED ORDER — STERILE WATER FOR IRRIGATION IR SOLN
Status: DC | PRN
  Administered 2023-01-12: 500 mL

## 2023-01-12 MED ORDER — MIDAZOLAM HCL 2 MG/2ML IJ SOLN
INTRAMUSCULAR | Status: DC | PRN
  Administered 2023-01-12: 2 mg via INTRAVENOUS

## 2023-01-12 MED ORDER — LIDOCAINE HCL (PF) 2 % IJ SOLN
INTRAMUSCULAR | Status: AC
  Filled 2023-01-12: qty 5

## 2023-01-12 MED ORDER — LACTATED RINGERS IV SOLN: INTRAVENOUS | Status: DC

## 2023-01-12 MED ORDER — ACETAMINOPHEN 500 MG PO TABS
1000.0000 mg | ORAL_TABLET | ORAL | Status: AC
  Administered 2023-01-12: 1000 mg via ORAL

## 2023-01-12 MED ORDER — CHLORHEXIDINE GLUCONATE CLOTH 2 % EX PADS: 6.0000 | MEDICATED_PAD | Freq: Once | CUTANEOUS | Status: DC

## 2023-01-12 MED ORDER — FENTANYL CITRATE (PF) 100 MCG/2ML IJ SOLN
INTRAMUSCULAR | Status: AC
  Filled 2023-01-12: qty 2

## 2023-01-12 MED ORDER — CEFAZOLIN SODIUM-DEXTROSE 2-4 GM/100ML-% IV SOLN
2.0000 g | INTRAVENOUS | Status: AC
  Administered 2023-01-12: 2 g via INTRAVENOUS

## 2023-01-12 MED ORDER — BUPIVACAINE-EPINEPHRINE 0.5% -1:200000 IJ SOLN
INTRAMUSCULAR | Status: DC | PRN
  Administered 2023-01-12: 30 mL

## 2023-01-12 MED ORDER — GABAPENTIN 300 MG PO CAPS
300.0000 mg | ORAL_CAPSULE | ORAL | Status: AC
  Administered 2023-01-12: 300 mg via ORAL

## 2023-01-12 MED ORDER — OXYCODONE HCL 5 MG/5ML PO SOLN: 5.0000 mg | Freq: Once | ORAL | Status: DC | PRN

## 2023-01-12 MED ORDER — ONDANSETRON HCL 4 MG/2ML IJ SOLN
INTRAMUSCULAR | Status: DC | PRN
  Administered 2023-01-12: 4 mg via INTRAVENOUS

## 2023-01-12 MED ORDER — KETOROLAC TROMETHAMINE 30 MG/ML IJ SOLN
INTRAMUSCULAR | Status: AC
  Filled 2023-01-12: qty 1

## 2023-01-12 MED ORDER — CHLORHEXIDINE GLUCONATE 0.12 % MT SOLN
OROMUCOSAL | Status: AC
  Filled 2023-01-12: qty 15

## 2023-01-12 MED ORDER — GLYCOPYRROLATE 0.2 MG/ML IJ SOLN
INTRAMUSCULAR | Status: DC | PRN
  Administered 2023-01-12: .2 mg via INTRAVENOUS

## 2023-01-12 MED ORDER — KETOROLAC TROMETHAMINE 30 MG/ML IJ SOLN
INTRAMUSCULAR | Status: DC | PRN
  Administered 2023-01-12: 30 mg via INTRAVENOUS

## 2023-01-12 MED ORDER — DEXAMETHASONE SODIUM PHOSPHATE 10 MG/ML IJ SOLN
INTRAMUSCULAR | Status: DC | PRN
  Administered 2023-01-12: 10 mg via INTRAVENOUS

## 2023-01-12 MED ORDER — OXYCODONE HCL 5 MG PO TABS: 5.0000 mg | ORAL_TABLET | Freq: Once | ORAL | Status: DC | PRN

## 2023-01-12 MED ORDER — CEFAZOLIN SODIUM-DEXTROSE 2-4 GM/100ML-% IV SOLN
INTRAVENOUS | Status: AC
  Filled 2023-01-12: qty 100

## 2023-01-12 MED ORDER — OXYCODONE HCL 5 MG PO TABS
5.0000 mg | ORAL_TABLET | ORAL | 0 refills | Status: DC | PRN
Start: 2023-01-12 — End: 2023-01-26

## 2023-01-12 MED ORDER — MIDAZOLAM HCL 2 MG/2ML IJ SOLN
INTRAMUSCULAR | Status: AC
  Filled 2023-01-12: qty 2

## 2023-01-12 MED ORDER — ORAL CARE MOUTH RINSE: 15.0000 mL | Freq: Once | OROMUCOSAL | Status: AC

## 2023-01-12 MED ORDER — GLYCOPYRROLATE 0.2 MG/ML IJ SOLN
INTRAMUSCULAR | Status: AC
  Filled 2023-01-12: qty 1

## 2023-01-12 MED ORDER — BUPIVACAINE HCL (PF) 0.5 % IJ SOLN
INTRAMUSCULAR | Status: AC
  Filled 2023-01-12: qty 30

## 2023-01-12 MED ORDER — FENTANYL CITRATE (PF) 100 MCG/2ML IJ SOLN
INTRAMUSCULAR | Status: DC | PRN
  Administered 2023-01-12 (×2): 25 ug via INTRAVENOUS
  Administered 2023-01-12: 50 ug via INTRAVENOUS

## 2023-01-12 MED ORDER — FAMOTIDINE 20 MG PO TABS
20.0000 mg | ORAL_TABLET | Freq: Once | ORAL | Status: AC
  Administered 2023-01-12: 20 mg via ORAL

## 2023-01-12 MED ORDER — PHENYLEPHRINE HCL (PRESSORS) 10 MG/ML IV SOLN
INTRAVENOUS | Status: DC | PRN
  Administered 2023-01-12 (×2): 80 ug via INTRAVENOUS

## 2023-01-12 MED ORDER — GABAPENTIN 300 MG PO CAPS
ORAL_CAPSULE | ORAL | Status: AC
  Filled 2023-01-12: qty 1

## 2023-01-12 MED ORDER — CHLORHEXIDINE GLUCONATE 0.12 % MT SOLN
15.0000 mL | Freq: Once | OROMUCOSAL | Status: AC
  Administered 2023-01-12: 15 mL via OROMUCOSAL

## 2023-01-12 MED ORDER — ONDANSETRON HCL 4 MG/2ML IJ SOLN
INTRAMUSCULAR | Status: AC
  Filled 2023-01-12: qty 2

## 2023-01-12 MED ORDER — PROPOFOL 10 MG/ML IV BOLUS
INTRAVENOUS | Status: DC | PRN
  Administered 2023-01-12: 150 mg via INTRAVENOUS

## 2023-01-12 MED ORDER — IBUPROFEN 600 MG PO TABS: 600.0000 mg | ORAL_TABLET | Freq: Three times a day (TID) | ORAL | 1 refills | Status: DC | PRN

## 2023-01-12 MED ORDER — LIDOCAINE HCL (CARDIAC) PF 100 MG/5ML IV SOSY
PREFILLED_SYRINGE | INTRAVENOUS | Status: DC | PRN
  Administered 2023-01-12: 100 mg via INTRAVENOUS

## 2023-01-12 MED ORDER — PHENYLEPHRINE 80 MCG/ML (10ML) SYRINGE FOR IV PUSH (FOR BLOOD PRESSURE SUPPORT)
PREFILLED_SYRINGE | INTRAVENOUS | Status: AC
  Filled 2023-01-12: qty 10

## 2023-01-12 MED ORDER — DEXAMETHASONE SODIUM PHOSPHATE 10 MG/ML IJ SOLN
INTRAMUSCULAR | Status: AC
  Filled 2023-01-12: qty 1

## 2023-01-12 MED ORDER — EPINEPHRINE PF 1 MG/ML IJ SOLN
INTRAMUSCULAR | Status: AC
  Filled 2023-01-12: qty 1

## 2023-01-12 MED ORDER — FAMOTIDINE 20 MG PO TABS
ORAL_TABLET | ORAL | Status: AC
  Filled 2023-01-12: qty 1

## 2023-01-12 MED ORDER — PROPOFOL 10 MG/ML IV BOLUS
INTRAVENOUS | Status: AC
  Filled 2023-01-12: qty 20

## 2023-01-12 MED ORDER — FENTANYL CITRATE (PF) 100 MCG/2ML IJ SOLN: 25.0000 ug | INTRAMUSCULAR | Status: DC | PRN

## 2023-01-12 MED ORDER — ACETAMINOPHEN 500 MG PO TABS
ORAL_TABLET | ORAL | Status: AC
  Filled 2023-01-12: qty 2

## 2023-01-12 SURGICAL SUPPLY — 54 items
ADH SKN CLS APL DERMABOND .7 (GAUZE/BANDAGES/DRESSINGS) ×1
APL PRP STRL LF DISP 70% ISPRP (MISCELLANEOUS) ×1
APPLIER CLIP 9.375 SM OPEN (CLIP)
APR CLP SM 9.3 20 MLT OPN (CLIP)
BINDER BREAST LRG (GAUZE/BANDAGES/DRESSINGS) IMPLANT
BINDER BREAST MEDIUM (GAUZE/BANDAGES/DRESSINGS) IMPLANT
BINDER BREAST XLRG (GAUZE/BANDAGES/DRESSINGS) IMPLANT
BLADE PHOTON ILLUMINATED (MISCELLANEOUS) ×2 IMPLANT
BLADE SURG 15 STRL LF DISP TIS (BLADE) ×4 IMPLANT
BLADE SURG 15 STRL SS (BLADE) ×2
CHLORAPREP W/TINT 26 (MISCELLANEOUS) ×2 IMPLANT
CLIP APPLIE 9.375 SM OPEN (CLIP) IMPLANT
CNTNR URN SCR LID CUP LEK RST (MISCELLANEOUS) IMPLANT
CONT SPEC 4OZ STRL OR WHT (MISCELLANEOUS)
DERMABOND ADVANCED .7 DNX12 (GAUZE/BANDAGES/DRESSINGS) ×2 IMPLANT
DEVICE DUBIN SPECIMEN MAMMOGRA (MISCELLANEOUS) ×2 IMPLANT
DRAPE LAPAROTOMY 100X77 ABD (DRAPES) ×2 IMPLANT
DRSG GAUZE FLUFF 36X18 (GAUZE/BANDAGES/DRESSINGS) ×2 IMPLANT
ELECT REM PT RETURN 9FT ADLT (ELECTROSURGICAL) ×1
ELECTRODE REM PT RTRN 9FT ADLT (ELECTROSURGICAL) ×2 IMPLANT
GAUZE 4X4 16PLY ~~LOC~~+RFID DBL (SPONGE) ×2 IMPLANT
GLOVE SURG SYN 7.0 (GLOVE) ×1 IMPLANT
GLOVE SURG SYN 7.0 PF PI (GLOVE) ×2 IMPLANT
GLOVE SURG SYN 7.5  E (GLOVE) ×1
GLOVE SURG SYN 7.5 E (GLOVE) ×1 IMPLANT
GLOVE SURG SYN 7.5 PF PI (GLOVE) ×2 IMPLANT
GOWN STRL REUS W/ TWL LRG LVL3 (GOWN DISPOSABLE) ×4 IMPLANT
GOWN STRL REUS W/TWL LRG LVL3 (GOWN DISPOSABLE) ×1
KIT MARKER MARGIN INK (KITS) IMPLANT
KIT TURNOVER KIT A (KITS) ×2 IMPLANT
LABEL ID WHT 2X3 OR (LABEL) IMPLANT
LABEL OR SOLS (LABEL) ×2 IMPLANT
MANIFOLD NEPTUNE II (INSTRUMENTS) ×2 IMPLANT
MARGIN MAP 10MM (MISCELLANEOUS) IMPLANT
MARKER MARGIN CORRECT CLIP (MARKER) IMPLANT
NDL HYPO 22X1.5 SAFETY MO (MISCELLANEOUS) ×2 IMPLANT
NEEDLE HYPO 22X1.5 SAFETY MO (MISCELLANEOUS) ×1 IMPLANT
PACK BASIN MINOR ARMC (MISCELLANEOUS) ×2 IMPLANT
SET LOCALIZER 20 PROBE US (MISCELLANEOUS) ×2 IMPLANT
SUT ETHILON 3-0 FS-10 30 BLK (SUTURE) ×1
SUT MNCRL 4-0 (SUTURE) ×1
SUT MNCRL 4-0 27XMFL (SUTURE) ×1
SUT SILK 3 0 SH 30 (SUTURE) ×2 IMPLANT
SUT VIC AB 2-0 SH 27 (SUTURE) ×1
SUT VIC AB 2-0 SH 27XBRD (SUTURE) IMPLANT
SUT VIC AB 3-0 SH 27 (SUTURE) ×1
SUT VIC AB 3-0 SH 27X BRD (SUTURE) ×2 IMPLANT
SUTURE EHLN 3-0 FS-10 30 BLK (SUTURE) IMPLANT
SUTURE MNCRL 4-0 27XMF (SUTURE) ×2 IMPLANT
SYR 10ML LL (SYRINGE) ×2 IMPLANT
TAPE TRANSPORE STRL 2 31045 (GAUZE/BANDAGES/DRESSINGS) IMPLANT
TRAP FLUID SMOKE EVACUATOR (MISCELLANEOUS) ×2 IMPLANT
TRAP NEPTUNE SPECIMEN COLLECT (MISCELLANEOUS) ×2 IMPLANT
WATER STERILE IRR 500ML POUR (IV SOLUTION) ×2 IMPLANT

## 2023-01-12 NOTE — Transfer of Care (Signed)
Immediate Anesthesia Transfer of Care Note  Patient: Jackie Shea  Procedure(s) Performed: BREAST LUMPECTOMY WITH RADIOFREQUENCY TAG IDENTIFICATION (Left: Breast)  Patient Location: PACU  Anesthesia Type:General  Level of Consciousness: awake, alert , and oriented  Airway & Oxygen Therapy: Patient Spontanous Breathing and Patient connected to face mask oxygen  Post-op Assessment: Report given to RN and Post -op Vital signs reviewed and stable  Post vital signs: Reviewed and stable  Last Vitals:  Vitals Value Taken Time  BP 145/95 01/12/23 1434  Temp 36.1 C 01/12/23 1434  Pulse 66 01/12/23 1435  Resp 12 01/12/23 1435  SpO2 100 % 01/12/23 1435  Vitals shown include unvalidated device data.  Last Pain:  Vitals:   01/12/23 1434  TempSrc:   PainSc: 0-No pain         Complications: No notable events documented.

## 2023-01-12 NOTE — Discharge Instructions (Addendum)
Discharge Instructions: 1.  Patient may shower, but do not scrub wounds heavily and dab dry only. 2.  Do not submerge wounds in pool/tub until fully healed. 3.  Do not apply ointments or hydrogen peroxide to the wounds. 4.  May apply ice packs to the wounds for comfort. 5.  Please wear breast binder at all times for the next two weeks.  May remove for showers.  May apply fluffed gauze over the incision to help with padding/comfort. 6.  Do not drive while taking narcotics for pain control.  Prior to driving, make sure you are able to rotate right and left to look at blindspots without significant pain or discomfort. 7.  Avoid strenuous activity with the left arm for two weeks.AMBULATORY SURGERY  DISCHARGE INSTRUCTIONS   The drugs that you were given will stay in your system until tomorrow so for the next 24 hours you should not:  Drive an automobile Make any legal decisions Drink any alcoholic beverage   You may resume regular meals tomorrow.  Today it is better to start with liquids and gradually work up to solid foods.  You may eat anything you prefer, but it is better to start with liquids, then soup and crackers, and gradually work up to solid foods.   Please notify your doctor immediately if you have any unusual bleeding, trouble breathing, redness and pain at the surgery site, drainage, fever, or pain not relieved by medication.    Additional Instructions:        Please contact your physician with any problems or Same Day Surgery at [phone], Monday through Friday 6 am to 4 pm, or  at [facility] number at [phone].

## 2023-01-12 NOTE — Interval H&P Note (Signed)
History and Physical Interval Note:  01/12/2023 12:29 PM  Jackie Shea  has presented today for surgery, with the diagnosis of left breast abnormality.  The various methods of treatment have been discussed with the patient and family. After consideration of risks, benefits and other options for treatment, the patient has consented to  Procedure(s): BREAST LUMPECTOMY WITH RADIOFREQUENCY TAG IDENTIFICATION (Left) as a surgical intervention.  The patient's history has been reviewed, patient examined, no change in status, stable for surgery.  I have reviewed the patient's chart and labs.  Questions were answered to the patient's satisfaction.     Nickoli Bagheri

## 2023-01-12 NOTE — Anesthesia Procedure Notes (Signed)
Procedure Name: LMA Insertion Date/Time: 01/12/2023 12:53 PM  Performed by: Aline Brochure, CRNAPre-anesthesia Checklist: Patient identified, Patient being monitored, Timeout performed, Emergency Drugs available and Suction available Patient Re-evaluated:Patient Re-evaluated prior to induction Oxygen Delivery Method: Circle system utilized Preoxygenation: Pre-oxygenation with 100% oxygen Induction Type: IV induction Ventilation: Mask ventilation without difficulty LMA: LMA inserted LMA Size: 4.0 Tube type: Oral Number of attempts: 1 Placement Confirmation: positive ETCO2 and breath sounds checked- equal and bilateral Tube secured with: Tape Dental Injury: Teeth and Oropharynx as per pre-operative assessment

## 2023-01-12 NOTE — Op Note (Signed)
  Procedure Date:  01/12/2023  Pre-operative Diagnosis:  left breast mass / distortion  Post-operative Diagnosis: left breast mass / distortion  Procedure:  left breast RF tag-localized lumpectomy  Surgeon:  Melvyn Neth, MD  Anesthesia:  General endotracheal  Estimated Blood Loss: 10 ml  Specimens:  Left breast mass  Complications:  None  Indications for Procedure:  This is a 62 y.o. female who presents with a left breast mass / distortion.  This was biopsied and although did not show any atypia, the findings were discordant in comparison to her mammogram.  Excision was recommended.  The risks of bleeding, infection, injury to surrounding structures, hematoma, seroma, open wound, cosmetic deformity, and the need for further surgery were all discussed with the patient and was willing to proceed.  Prior to this procedure, the patient had undergone RF tag localization.  Description of Procedure: The patient was correctly identified in the preoperative area and brought into the operating room.  The patient was placed supine with VTE prophylaxis in place.  Appropriate time-outs were performed.  Anesthesia was induced and the patient was intubated.  Appropriate antibiotics were infused.  The left chest was prepped and draped in usual sterile fashion.  The RF tag localization site was determined using the Hologic probe.  An incision was made overlying the tag and clip.  Hologic probe was used to guide our dissection using electrocautery, and a partial mastectomy was performed with adequate margins.  MarginMarker was used to ink each of the sides of the specimen, in addition to CorrectClips placement on each side.  The specimen was then imaged to confirm that the area of concern, biopsy clip, and RF tag were included in the excision.  This was then sent to pathology.  The cavity was irrigated and hemostasis was assured with electrocautery.  Local anesthetic was infiltrated into the skin and  subcutaneous tissue of the cavity.  The wound was then closed in multiple layers with 2-0 Vicryl, 3-0 Vicryl and 4-0 Monocryl and sealed with DermaBond.  The patient was emerged from anesthesia and extubated and brought to the recovery room for further management.  The patient tolerated the procedure well and all counts were correct at the end of the case.   Melvyn Neth, MD

## 2023-01-12 NOTE — Anesthesia Preprocedure Evaluation (Addendum)
Anesthesia Evaluation  Patient identified by MRN, date of birth, ID band Patient awake    Reviewed: Allergy & Precautions, NPO status , Patient's Chart, lab work & pertinent test results  History of Anesthesia Complications Negative for: history of anesthetic complications  Airway Mallampati: II  TM Distance: >3 FB Neck ROM: full    Dental no notable dental hx.    Pulmonary former smoker   Pulmonary exam normal        Cardiovascular hypertension, On Medications + CAD  Normal cardiovascular exam     Neuro/Psych negative neurological ROS  negative psych ROS   GI/Hepatic Neg liver ROS, hiatal hernia,,,  Endo/Other  negative endocrine ROS    Renal/GU      Musculoskeletal   Abdominal   Peds  Hematology negative hematology ROS (+)   Anesthesia Other Findings Past Medical History: No date: Aortic atherosclerosis No date: Arthritis No date: Hiatal hernia No date: Hypertension No date: Left ovarian cyst 01/07/2023: Left renal mass     Comment:  a.) CT AP 01/07/2023 - Bosniak IV cystic mass measuring               15 x 11 mm 01/07/2023: Leiomyoma of uterus     Comment:  a.) CT AP 01/07/2023 - several large leiomyomata               measuring up to 6.7 cm  Past Surgical History: 12/13/2022: BREAST BIOPSY; Left     Comment:  left breast stereo bx, distortion, x marker, path               discordant 12/13/2022: BREAST BIOPSY; Left     Comment:  MM LT BREAST BX W LOC DEV 1ST LESION IMAGE BX SPEC               STEREO GUIDE 12/13/2022 [facility] 01/03/2023: MM LT RADIO FREQUENCY TAG EA ADD LESION LOC MAMMO GUIDE;  Left     Comment:  RF tag placement  BMI    Body Mass Index: 30.12 kg/m      Reproductive/Obstetrics negative OB ROS                              Anesthesia Physical Anesthesia Plan  ASA: 2  Anesthesia Plan: General LMA   Post-op Pain Management: Tylenol PO  (pre-op)*, Gabapentin PO (pre-op)* and Toradol IV (intra-op)*   Induction: Intravenous  PONV Risk Score and Plan: 3 and Dexamethasone, Ondansetron, Midazolam and Treatment may vary due to age or medical condition  Airway Management Planned: LMA  Additional Equipment:   Intra-op Plan:   Post-operative Plan: Extubation in OR  Informed Consent: I have reviewed the patients History and Physical, chart, labs and discussed the procedure including the risks, benefits and alternatives for the proposed anesthesia with the patient or authorized representative who has indicated his/her understanding and acceptance.     Dental Advisory Given  Plan Discussed with: Anesthesiologist, CRNA and Surgeon  Anesthesia Plan Comments: (Patient consented for risks of anesthesia including but not limited to:  - adverse reactions to medications - damage to eyes, teeth, lips or other oral mucosa - nerve damage due to positioning  - sore throat or hoarseness - Damage to heart, brain, nerves, lungs, other parts of body or loss of life  Patient voiced understanding.)         Anesthesia Quick Evaluation

## 2023-01-13 NOTE — Anesthesia Postprocedure Evaluation (Signed)
Anesthesia Post Note  Patient: Jackie Shea  Procedure(s) Performed: BREAST LUMPECTOMY WITH RADIOFREQUENCY TAG IDENTIFICATION (Left: Breast)  Patient location during evaluation: PACU Anesthesia Type: General Level of consciousness: awake and alert Pain management: pain level controlled Vital Signs Assessment: post-procedure vital signs reviewed and stable Respiratory status: spontaneous breathing, nonlabored ventilation, respiratory function stable and patient connected to nasal cannula oxygen Cardiovascular status: blood pressure returned to baseline and stable Postop Assessment: no apparent nausea or vomiting Anesthetic complications: no   No notable events documented.   Last Vitals:  Vitals:   01/12/23 1500 01/12/23 1516  BP: (!) 154/82 (!) 172/79  Pulse: 66 (!) 59  Resp: 16 18  Temp:  36.4 C  SpO2: 95% 94%    Last Pain:  Vitals:   01/12/23 1516  TempSrc: Temporal  PainSc: 0-No pain                 Ilene Qua

## 2023-01-17 LAB — SURGICAL PATHOLOGY

## 2023-01-17 NOTE — Progress Notes (Signed)
01/17/23 Patient called and discussed pathology results of left breast lumpectomy on 01/12/23.  All questions answered.  Has follow up with me on 01/26/23.  Henrene Dodge, MD

## 2023-01-26 ENCOUNTER — Encounter: Payer: Self-pay | Admitting: Surgery

## 2023-01-26 ENCOUNTER — Ambulatory Visit (INDEPENDENT_AMBULATORY_CARE_PROVIDER_SITE_OTHER): Payer: Medicaid Other | Admitting: Surgery

## 2023-01-26 VITALS — BP 116/72 | HR 72 | Temp 98.0°F | Ht 65.0 in | Wt 179.2 lb

## 2023-01-26 DIAGNOSIS — Z09 Encounter for follow-up examination after completed treatment for conditions other than malignant neoplasm: Secondary | ICD-10-CM

## 2023-01-26 DIAGNOSIS — R928 Other abnormal and inconclusive findings on diagnostic imaging of breast: Secondary | ICD-10-CM

## 2023-01-26 DIAGNOSIS — N6321 Unspecified lump in the left breast, upper outer quadrant: Secondary | ICD-10-CM

## 2023-01-26 NOTE — Patient Instructions (Addendum)
Continue your annual mammograms with your PCP  Follow-up with our office as needed.  Please call and ask to speak with a nurse if you develop questions or concerns.   GENERAL POST-OPERATIVE PATIENT INSTRUCTIONS   WOUND CARE INSTRUCTIONS: If clothing rubs against the wound or causes irritation and the wound is not draining you may cover it with a dry dressing during the daytime.  Try to keep the wound dry and avoid ointments on the wound unless directed to do so.  If the wound becomes bright red and painful or starts to drain infected material that is not clear, please contact your physician immediately.  If the wound is mildly pink and has a thick firm ridge underneath it, this is normal, and is referred to as a healing ridge.  This will resolve over the next 4-6 weeks.  BATHING: You may shower if you have been informed of this by your surgeon. However, Please do not submerge in a tub, hot tub, or pool until incisions are completely sealed or have been told by your surgeon that you may do so.  DIET:  You may eat any foods that you can tolerate.  It is a good idea to eat a high fiber diet and take in plenty of fluids to prevent constipation.  If you do become constipated you may want to take a mild laxative or take ducolax tablets on a daily basis until your bowel habits are regular.  Constipation can be very uncomfortable, along with straining, after recent surgery.  ACTIVITY:  You are encouraged to cough and deep breath or use your incentive spirometer if you were given one, every 15-30 minutes when awake.  This will help prevent respiratory complications and low grade fevers post-operatively if you had a general anesthetic. Soreness after doing exercises or activities of daily living is normal as you get back in to your normal routine.  MEDICATIONS:  Try to take narcotic medications and anti-inflammatory medications, such as tylenol, ibuprofen, naprosyn, etc., with food.  This will minimize  stomach upset from the medication.  Should you develop nausea and vomiting from the pain medication, or develop a rash, please discontinue the medication and contact your physician.  You should not drive, make important decisions, or operate machinery when taking narcotic pain medication.  SUNBLOCK Use sun block to incision area over the next year if this area will be exposed to sun. This helps decrease scarring and will allow you avoid a permanent darkened area over your incision.  QUESTIONS:  Please feel free to call our office if you have any questions, and we will be glad to assist you. [phone]

## 2023-01-26 NOTE — Progress Notes (Signed)
01/26/2023  HPI: Jackie Shea is a 62 y.o. female s/p left breast RF tag localized lumpectomy on 01/12/23. She had a prior mammogram which showed an architectural distortion in the left breast.  Although biopsy was negative, the findings were discordant and resection was recommended.  Final pathology showed benign mammary parenchyma with dense stromal fibrosis, fat necrosis, and foreign body type giant cell reaction.  Negative for any atypical proliferative breast disease.    Patient reports today that she's feeling very well.  Denies any pain at the incision currently, drainage, or other concerns.  She did have one episode of sharp pain at the incision when she bent over one day, but went away quickly.  Vital signs: BP 116/72   Pulse 72   Temp 98 F (36.7 C) (Oral)   Ht  (1.651 m)   Wt 179 lb 3.2 oz (81.3 kg)   SpO2 97%   BMI 29.82 kg/m    Physical Exam: Constitutional: No acute distress Breast:  Left breast s/p lumpectomy in upper outer quadrant.  Incision is healing well and is clean, dry, intact, without any evidence of infection.  No palpable masses, other skin changes, or nipple changes.  Assessment/Plan: This is a 62 y.o. female s/p left breast lumpectomy.  --Discussed with her again the results of her lumpectomy showing no malignancy or atypia.  Discussed with her again the rationale behind proceeding with lumpectomy.  She's currently healing well and discussed that she may have some episodes of sharp discomfort that may be related to scar tissue, but these should be infrequent.  If any worsening issues, she should contact us so we can evaluate her. --Otherwise, she just needs to follow up with her PCP for yearly mammograms/exams. --Follow up as needed.   Howie Ill, MD [facility]

## 2024-05-24 ENCOUNTER — Other Ambulatory Visit: Payer: Self-pay

## 2024-05-24 DIAGNOSIS — Z1231 Encounter for screening mammogram for malignant neoplasm of breast: Secondary | ICD-10-CM

## 2024-06-05 ENCOUNTER — Emergency Department
Admission: EM | Admit: 2024-06-05 | Discharge: 2024-06-05 | Disposition: A | Discharge status: Home / Self Care | Attending: Emergency Medicine | Admitting: Emergency Medicine

## 2024-06-05 ENCOUNTER — Other Ambulatory Visit: Payer: Self-pay

## 2024-06-05 DIAGNOSIS — I10 Essential (primary) hypertension: Secondary | ICD-10-CM | POA: Diagnosis not present

## 2024-06-05 DIAGNOSIS — M79644 Pain in right finger(s): Secondary | ICD-10-CM | POA: Diagnosis present

## 2024-06-05 DIAGNOSIS — L03011 Cellulitis of right finger: Secondary | ICD-10-CM | POA: Insufficient documentation

## 2024-06-05 MED ORDER — LIDOCAINE HCL (PF) 1 % IJ SOLN
5.0000 mL | Freq: Once | INTRAMUSCULAR | Status: AC
  Administered 2024-06-05: 5 mL
  Filled 2024-06-05: qty 5

## 2024-06-05 MED ORDER — TETANUS-DIPHTH-ACELL PERTUSSIS 5-2.5-18.5 LF-MCG/0.5 IM SUSY
0.5000 mL | PREFILLED_SYRINGE | Freq: Once | INTRAMUSCULAR | Status: AC
  Administered 2024-06-05: 0.5 mL via INTRAMUSCULAR
  Filled 2024-06-05: qty 0.5

## 2024-06-05 MED ORDER — SULFAMETHOXAZOLE-TRIMETHOPRIM 800-160 MG PO TABS: 1.0000 | ORAL_TABLET | Freq: Two times a day (BID) | ORAL | 0 refills | Status: AC

## 2024-06-05 MED ORDER — SULFAMETHOXAZOLE-TRIMETHOPRIM 800-160 MG PO TABS
1.0000 | ORAL_TABLET | Freq: Once | ORAL | Status: AC
  Administered 2024-06-05: 1 via ORAL
  Filled 2024-06-05: qty 1

## 2024-06-05 NOTE — ED Provider Notes (Signed)
 [facility] Provider Note    None    (approximate)   History   Hand Pain   HPI  Jackie Shea is a 63 y.o. female  with a past medical history of HTN presents to the emergency department with pain to her right middle finger x 1 week.  Patient states she woke up this morning and had some swelling on the distal portion of her finger and the pain has gotten worse.  Denies fever, chills, puslike drainage, numbness or tingling.  Denies fall or injury.  Patient is unsure when her last tetanus shot was.  Patient has no allergies.      Physical Exam   Triage Vital Signs: ED Triage Vitals  Encounter Vitals Group     BP 06/05/24 1622 (!) 150/83     Girls Systolic BP Percentile --      Girls Diastolic BP Percentile --      Boys Systolic BP Percentile --      Boys Diastolic BP Percentile --      Pulse Rate 06/05/24 1622 87     Resp 06/05/24 1622 18     Temp 06/05/24 1622 98.5 F (36.9 C)     Temp src --      SpO2 06/05/24 1622 96 %     Weight 06/05/24 1623 185 lb (83.9 kg)     Height 06/05/24 1623 5' 5 (1.651 m)     Head Circumference --      Peak Flow --      Pain Score 06/05/24 1623 10     Pain Loc --      Pain Education --      Exclude from Growth Chart --     Most recent vital signs: Vitals:   06/05/24 1622  BP: (!) 150/83  Pulse: 87  Resp: 18  Temp: 98.5 F (36.9 C)  SpO2: 96%    General: Awake, in no acute distress. Appears stated age. Head: Normocephalic, atraumatic. CV: Good peripheral perfusion. Cap refill <2 sec b/l. Respiratory:Normal respiratory effort.  No respiratory distress.  GI: Soft, non-distended.  MSK: Able to passively and active extend all joints of the fingers of the right hand. Skin:Warm, dry, intact.  Swelling along the right middle finger pad with some fluctuance noted just proximal to the fingernail, TTP along both regions. Neurological: A&Ox4 to person, place, time, and situation.   ED Results / Procedures /  Treatments   Labs (all labs ordered are listed, but only abnormal results are displayed) Labs Reviewed - No data to display   EKG     RADIOLOGY    PROCEDURES:  Critical Care performed: No   .Incision and Drainage  Date/Time: 06/05/2024 7:22 PM  Performed by: Sheron Salm, PA-C Authorized by: Sheron Salm, PA-C   Consent:    Consent obtained:  Verbal   Consent given by:  Patient   Risks, benefits, and alternatives were discussed: yes     Risks discussed:  Bleeding, damage to other organs, infection, incomplete drainage and pain   Alternatives discussed:  No treatment and referral Universal protocol:    Procedure explained and questions answered to patient or proxy's satisfaction: yes     Immediately prior to procedure, a time out was called: yes     Patient identity confirmed:  Verbally with patient Location:    Type:  Fluid collection (paronychia)   Location: right middle finger. Pre-procedure details:    Skin preparation:  Povidone-iodine Sedation:  Sedation type:  None Anesthesia:    Anesthesia method:  Nerve block   Block location:  Right middle finger   Block needle gauge:  25 G   Block anesthetic:  Lidocaine  1% w/o epi   Block technique:  Digital block   Block injection procedure:  Anatomic landmarks identified, anatomic landmarks palpated, introduced needle, negative aspiration for blood and incremental injection   Block outcome:  Anesthesia achieved Procedure type:    Complexity:  Simple Procedure details:    Incision types:  Single straight   Drainage:  Purulent and bloody   Drainage amount:  Moderate   Packing materials:  None Post-procedure details:    Procedure completion:  Tolerated well, no immediate complications    MEDICATIONS ORDERED IN ED: Medications  Tdap (BOOSTRIX) injection 0.5 mL (0.5 mLs Intramuscular Given 06/05/24 1727)  lidocaine  (PF) (XYLOCAINE ) 1 % injection 5 mL (5 mLs Infiltration Given 06/05/24 1728)   sulfamethoxazole -trimethoprim  (BACTRIM  DS) 800-160 MG per tablet 1 tablet (1 tablet Oral Given 06/05/24 1830)     IMPRESSION / MDM / ASSESSMENT AND PLAN / ED COURSE  I reviewed the triage vital signs and the nursing notes.                              Differential diagnosis includes, but is not limited to, paronychia, felon, flexor tenosynovitis  Patient's presentation is most consistent with acute illness / injury with system symptoms.  Patient is a 63 year old female who presented with symptoms and physical exam consistent with felon versus paronychia.  Patient was able to passively and actively extend and flex her finger, with pain, swelling and erythema localized to the DIP no concern for flexor tenosynovitis at this time. Incision and drainage performed after anesthesia with lidocaine  1% w/o epi for digital block. Incision made along the ulnar aspect of the right middle finger, did not produce any purulent drainage, only bloody.  Also incised along the lateral aspect of the nail and along the nail bed which produce some purulent drainage.  Believe this patient does have some combination of paronychia and a felon, as her finger is still mildly swollen compared to the left middle finger following the procedure.  Will provide her with 1 dose of Bactrim  here and the remainder sent to the pharmacy.  Tdap updated.  Wound care instructions discussed with the patient.  Will have her follow-up with her primary care provider in 2 days for wound check.  Discussed ED return precautions regarding worsening infection despite antibiotics or signs or symptoms of flexor tenosynovitis.  The patient may return to the emergency department for any new, worsening, or concerning symptoms. Patient was given the opportunity to ask questions; all questions were answered. Emergency department return precautions were discussed with the patient.  Patient is in agreement to the treatment plan.  Patient is stable for  discharge.   FINAL CLINICAL IMPRESSION(S) / ED DIAGNOSES   Final diagnoses:  Paronychia of right middle finger  Felon of finger of right hand     Rx / DC Orders   ED Discharge Orders          Ordered    sulfamethoxazole -trimethoprim  (BACTRIM  DS) 800-160 MG tablet  2 times daily        06/05/24 1821             Note:  This document was prepared using Dragon voice recognition software and may include unintentional dictation errors.  Sheron Salm, PA-C 06/05/24 1929    Willo Dunnings, MD 06/05/24 2001

## 2024-06-05 NOTE — ED Triage Notes (Signed)
 Pt comes in via pov with complaints of swelling to her right middle finger that started this morning. Pt states that her finger was aching all last week, and she woke up this morning with it swollen. Pt complains of pain 10/10,  and states that her finger is painful to the touch.

## 2024-06-05 NOTE — ED Notes (Signed)
 See triage note  Presents with pain and swelling to right middle finger  Denies any injury  States she developed swelling about 1 week ago Afebrile on arrival

## 2024-06-05 NOTE — Discharge Instructions (Addendum)
 You were seen in the emergency department for a infection of your finger.  Please pick up and take the antibiotics as prescribed.  Please leave the bandage on for 24 hours and then clean it with soap and water  afterwards.  You can also use Epson salt and warm water  soaks 1-2 times daily.  Please follow-up with your primary care provider for wound recheck.  You may follow-up with the emergency department for any worsening symptoms as well as fever, chills,increasing redness, swelling, or pus-like discharge  or intolerable pain despite taking antibiotics.

## 2024-08-08 ENCOUNTER — Emergency Department
Admission: EM | Admit: 2024-08-08 | Discharge: 2024-08-08 | Disposition: A | Discharge status: Home / Self Care | Attending: Emergency Medicine | Admitting: Emergency Medicine

## 2024-08-08 ENCOUNTER — Emergency Department

## 2024-08-08 ENCOUNTER — Other Ambulatory Visit: Payer: Self-pay

## 2024-08-08 DIAGNOSIS — N950 Postmenopausal bleeding: Secondary | ICD-10-CM | POA: Diagnosis present

## 2024-08-08 LAB — URINALYSIS, ROUTINE W REFLEX MICROSCOPIC
Bilirubin Urine: NEGATIVE
Glucose, UA: NEGATIVE mg/dL
Ketones, ur: NEGATIVE mg/dL
Leukocytes,Ua: NEGATIVE
Nitrite: NEGATIVE
Protein, ur: NEGATIVE mg/dL
Specific Gravity, Urine: 1.02 (ref 1.005–1.030)
pH: 5 (ref 5.0–8.0)

## 2024-08-08 LAB — COMPREHENSIVE METABOLIC PANEL WITH GFR
ALT: 14 U/L (ref 0–44)
AST: 16 U/L (ref 15–41)
Albumin: 3.7 g/dL (ref 3.5–5.0)
Alkaline Phosphatase: 96 U/L (ref 38–126)
Anion gap: 12 (ref 5–15)
BUN: 18 mg/dL (ref 8–23)
CO2: 23 mmol/L (ref 22–32)
Calcium: 9 mg/dL (ref 8.9–10.3)
Chloride: 106 mmol/L (ref 98–111)
Creatinine, Ser: 0.89 mg/dL (ref 0.44–1.00)
GFR, Estimated: >60 mL/min (ref >60)
Glucose, Bld: 96 mg/dL (ref 70–99)
Potassium: 3.4 mmol/L — ABNORMAL LOW (ref 3.5–5.1)
Sodium: 141 mmol/L (ref 135–145)
Total Bilirubin: 0.5 mg/dL (ref 0.0–1.2)
Total Protein: 7.9 g/dL (ref 6.5–8.1)

## 2024-08-08 LAB — CBC WITH DIFFERENTIAL/PLATELET
Abs Immature Granulocytes: 0.02 K/uL (ref 0.00–0.07)
Basophils Absolute: 0 K/uL (ref 0.0–0.1)
Basophils Relative: 0 %
Eosinophils Absolute: 0 K/uL (ref 0.0–0.5)
Eosinophils Relative: 1 %
HCT: 41.4 % (ref 36.0–46.0)
Hemoglobin: 12.8 g/dL (ref 12.0–15.0)
Immature Granulocytes: 0 %
Lymphocytes Relative: 23 %
Lymphs Abs: 1.6 K/uL (ref 0.7–4.0)
MCH: 23 pg — ABNORMAL LOW (ref 26.0–34.0)
MCHC: 30.9 g/dL (ref 30.0–36.0)
MCV: 74.3 fL — ABNORMAL LOW (ref 80.0–100.0)
Monocytes Absolute: 0.6 K/uL (ref 0.1–1.0)
Monocytes Relative: 8 %
Neutro Abs: 4.7 K/uL (ref 1.7–7.7)
Neutrophils Relative %: 68 %
Platelets: 308 K/uL (ref 150–400)
RBC: 5.57 MIL/uL — ABNORMAL HIGH (ref 3.87–5.11)
RDW: 17.2 % — ABNORMAL HIGH (ref 11.5–15.5)
WBC: 6.9 K/uL (ref 4.0–10.5)
nRBC: 0 % (ref 0.0–0.2)

## 2024-08-08 NOTE — ED Notes (Signed)
 Pelvic cart placed at bedside and pt undressed from waste down.

## 2024-08-08 NOTE — ED Notes (Signed)
 Assisted Dr. Levander with pelvic exam.

## 2024-08-08 NOTE — ED Provider Notes (Signed)
 [facility] Provider Note    Event Date/Time   First MD Initiated Contact with Patient 08/08/24 [phone]     (approximate)   History   Vaginal Bleeding   HPI  Jackie Shea is a 63 year old female presenting to the ER for evaluation of vaginal bleeding.  Last week patient noticed that she began to have spotting vaginally, has progressed to bleeding similar to her prior menstrual cycles.  Reports that she is going through about 3 pads a day.  No chest pain, shortness of breath, lightheadedness.  No history of similar since she has been postmenopausal.  No abdominal pain, no trauma.      Physical Exam   Triage Vital Signs: ED Triage Vitals  Encounter Vitals Group     BP 08/08/24 1621 (!) 170/87     Girls Systolic BP Percentile --      Girls Diastolic BP Percentile --      Boys Systolic BP Percentile --      Boys Diastolic BP Percentile --      Pulse Rate 08/08/24 1621 89     Resp 08/08/24 1621 17     Temp 08/08/24 1621 98.4 F (36.9 C)     Temp src --      SpO2 08/08/24 1621 100 %     Weight 08/08/24 1622 189 lb (85.7 kg)     Height 08/08/24 1622 5' 5 (1.651 m)     Head Circumference --      Peak Flow --      Pain Score 08/08/24 1622 0     Pain Loc --      Pain Education --      Exclude from Growth Chart --     Most recent vital signs: Vitals:   08/08/24 1621 08/08/24 1630  BP: (!) 170/87   Pulse: 89   Resp: 17   Temp: 98.4 F (36.9 C)   SpO2: 100% 100%     General: Awake, interactive  CV:  Good peripheral perfusion Resp:  Unlabored respirations Abd:  Nondistended, soft, nontender to palpation GU:   External genitalia unremarkable.  Speculum exam with small to moderate amount of blood noted within the vaginal vault.  On bimanual exam, cervix is closed with some irregular texture over palpable cervix without palpable mass.  No cervical motion tenderness, adnexal tenderness noted. Neuro:  Symmetric facial movement, fluid  speech   ED Results / Procedures / Treatments   Labs (all labs ordered are listed, but only abnormal results are displayed) Labs Reviewed  CBC WITH DIFFERENTIAL/PLATELET - Abnormal; Notable for the following components:      Result Value   RBC 5.57 (*)    MCV 74.3 (*)    MCH 23.0 (*)    RDW 17.2 (*)    All other components within normal limits  URINALYSIS, ROUTINE W REFLEX MICROSCOPIC - Abnormal; Notable for the following components:   Color, Urine YELLOW (*)    APPearance HAZY (*)    Hgb urine dipstick LARGE (*)    Bacteria, UA RARE (*)    All other components within normal limits  COMPREHENSIVE METABOLIC PANEL WITH GFR - Abnormal; Notable for the following components:   Potassium 3.4 (*)    All other components within normal limits     EKG EKG independently reviewed and interpreted by myself demonstrates:    RADIOLOGY Imaging independently reviewed and interpreted by myself demonstrates:  Pelvic ultrasound demonstrates a thickened endometrium with previously noted adnexal  cysts  Formal Radiology Read:  US  PELVIC COMPLETE WITH TRANSVAGINAL Result Date: 08/08/2024 CLINICAL DATA:  Postmenopausal bleeding. EXAM: TRANSABDOMINAL AND TRANSVAGINAL ULTRASOUND OF PELVIS TECHNIQUE: Both transabdominal and transvaginal ultrasound examinations of the pelvis were performed. Transabdominal technique was performed for global imaging of the pelvis including uterus, ovaries, adnexal regions, and pelvic cul-de-sac. It was necessary to proceed with endovaginal exam following the transabdominal exam to visualize the endometrium and ovaries. COMPARISON:  ultrasound dated 08/16/2012 and CT abdomen pelvis dated 01/07/2023. FINDINGS: Uterus Measurements: 11.4 x 6.7 x 7.7 cm = volume: 307 mL. 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 The largest fibroid measures 8.5 x 6.5 x 6.7 cm. Endometrium Thickness: 17 mm. Trace fluid noted in the endometrium. The endometrium is thickened for a postmenopausal  female and in the setting of bleeding may represent endometrial hyperplasia, polyp, or neoplasm. Right ovary Not visualized. Left ovary Not visualized. An 8 x 5 x 4 cm tubular or multi-cystic structure in the region of the left adnexa is poorly visualized and suboptimally evaluated but present on the CT of 01/07/2023. Other findings No abnormal free fluid. IMPRESSION: 1. Thickened endometrium. Further evaluation with hysteroscopy is recommended. 2. Myomatous uterus. 3. Left adnexal cysts better seen on the prior CT. Electronically Signed   By: Vanetta Chou M.D.   On: 08/08/2024 18:14    PROCEDURES:  Critical Care performed: No  Procedures   MEDICATIONS ORDERED IN ED: Medications - No data to display   IMPRESSION / MDM / ASSESSMENT AND PLAN / ED COURSE  I reviewed the triage vital signs and the nursing notes.  Differential diagnosis includes, but is not limited to, atrophic endometrium, endometrial mass, malignancy, fibroids, polyp, no visible trauma on exam  Patient's presentation is most consistent with acute presentation with potential threat to life or bodily function.  63 year old female presenting with postmenopausal vaginal bleeding without associated abdominal pain.  Stable vitals on presentation.  Labs with reassuring CBC including normal hemoglobin 12.8, stable compared to priors, CMP without significant derangement.  Urinalysis without evidence of infection. Pelvic exam demonstrates ongoing bleeding, but not brisk, cervix able to be visualized.  Pelvic ultrasound demonstrates thickened endometrium. Discussed with patient that she will need further evaluation as an outpatient for her postmenopausal bleeding and her workup today did not rule out significant pathology such as malignancy.  She expressed understanding.  She is established with [facility] OB[facility]GY[facility] and reports she has an appointment on November 4.  She is comfortable with discharge home.  Strict return precautions were  provided including for development of heavy vaginal bleeding or signs of symptomatic anemia.  Patient discharged in stable condition.     FINAL CLINICAL IMPRESSION(S) / ED DIAGNOSES   Final diagnoses:  Postmenopausal vaginal bleeding     Rx / DC Orders   ED Discharge Orders     None        Note:  This document was prepared using Dragon voice recognition software and may include unintentional dictation errors.   Levander Slate, MD 08/08/24 [phone]

## 2024-08-08 NOTE — ED Triage Notes (Signed)
 Pt comes in via pov with complaints of vaginal bleeding since last week. Pt has no complaints of pain at this time. Pt states that the bleeding started out as spotting and now has progressed to heavy. Pt reports going thru about 3 pads a day.

## 2024-08-08 NOTE — Discharge Instructions (Addendum)
 You were seen in the emergency department today for evaluation of your vaginal bleeding. Your blood count here was stable, but your bleeding does need further testing. Please keep your scheduled follow-up with OB/GYN next week.  Return to the ER for new or worsening symptoms including heavy vaginal bleeding such as saturating a pad every hour for 2 hours in a row, this of breath, chest pain, lightheadedness, or any other new or concerning symptoms.

## 2024-08-14 ENCOUNTER — Encounter: Payer: Self-pay | Admitting: Gastroenterology

## 2024-09-01 ENCOUNTER — Encounter: Admission: RE | Payer: Self-pay | Source: Home / Self Care

## 2024-09-01 ENCOUNTER — Ambulatory Visit: Admission: RE | Admit: 2024-09-01 | Source: Home / Self Care | Admitting: Gastroenterology

## 2024-09-01 SURGERY — COLONOSCOPY
Anesthesia: General
